# Patient Record
Sex: Female | Born: 1970 | Hispanic: Refuse to answer | Marital: Married | Smoking: Never smoker
Health system: Southern US, Community
[De-identification: ages and names within clinical notes are randomized; demographics above are authoritative.]

## PROBLEM LIST (undated history)

## (undated) DIAGNOSIS — I499 Cardiac arrhythmia, unspecified: Secondary | ICD-10-CM

## (undated) DIAGNOSIS — M545 Low back pain, unspecified: Secondary | ICD-10-CM

## (undated) DIAGNOSIS — F32A Depression, unspecified: Secondary | ICD-10-CM

## (undated) DIAGNOSIS — E079 Disorder of thyroid, unspecified: Secondary | ICD-10-CM

## (undated) DIAGNOSIS — G43909 Migraine, unspecified, not intractable, without status migrainosus: Secondary | ICD-10-CM

## (undated) DIAGNOSIS — R2689 Other abnormalities of gait and mobility: Secondary | ICD-10-CM

## (undated) DIAGNOSIS — K649 Unspecified hemorrhoids: Secondary | ICD-10-CM

## (undated) DIAGNOSIS — G47419 Narcolepsy without cataplexy: Secondary | ICD-10-CM

## (undated) DIAGNOSIS — F329 Major depressive disorder, single episode, unspecified: Secondary | ICD-10-CM

## (undated) DIAGNOSIS — Z86010 Personal history of colonic polyps: Secondary | ICD-10-CM

## (undated) DIAGNOSIS — N6019 Diffuse cystic mastopathy of unspecified breast: Secondary | ICD-10-CM

## (undated) DIAGNOSIS — N9419 Other specified dyspareunia: Secondary | ICD-10-CM

## (undated) DIAGNOSIS — M25512 Pain in left shoulder: Secondary | ICD-10-CM

## (undated) DIAGNOSIS — F419 Anxiety disorder, unspecified: Secondary | ICD-10-CM

## (undated) DIAGNOSIS — E875 Hyperkalemia: Secondary | ICD-10-CM

## (undated) DIAGNOSIS — M5090 Cervical disc disorder, unspecified, unspecified cervical region: Secondary | ICD-10-CM

## (undated) DIAGNOSIS — G47411 Narcolepsy with cataplexy: Secondary | ICD-10-CM

## (undated) DIAGNOSIS — R7989 Other specified abnormal findings of blood chemistry: Secondary | ICD-10-CM

## (undated) DIAGNOSIS — E785 Hyperlipidemia, unspecified: Secondary | ICD-10-CM

## (undated) DIAGNOSIS — K589 Irritable bowel syndrome without diarrhea: Secondary | ICD-10-CM

## (undated) DIAGNOSIS — I773 Arterial fibromuscular dysplasia: Secondary | ICD-10-CM

## (undated) DIAGNOSIS — M79602 Pain in left arm: Secondary | ICD-10-CM

## (undated) DIAGNOSIS — E119 Type 2 diabetes mellitus without complications: Secondary | ICD-10-CM

## (undated) DIAGNOSIS — E559 Vitamin D deficiency, unspecified: Secondary | ICD-10-CM

## (undated) DIAGNOSIS — R600 Localized edema: Secondary | ICD-10-CM

## (undated) DIAGNOSIS — M797 Fibromyalgia: Secondary | ICD-10-CM

## (undated) DIAGNOSIS — Z860101 Personal history of adenomatous and serrated colon polyps: Secondary | ICD-10-CM

## (undated) DIAGNOSIS — K760 Fatty (change of) liver, not elsewhere classified: Secondary | ICD-10-CM

## (undated) DIAGNOSIS — R55 Syncope and collapse: Secondary | ICD-10-CM

## (undated) DIAGNOSIS — I10 Essential (primary) hypertension: Secondary | ICD-10-CM

## (undated) DIAGNOSIS — J3089 Other allergic rhinitis: Secondary | ICD-10-CM

## (undated) HISTORY — PX: ABDOMINAL HYSTERECTOMY: SHX81

## (undated) HISTORY — PX: HERNIA REPAIR: SHX51

## (undated) HISTORY — PX: HEMORRHOID SURGERY: SHX153

---

## 2004-07-06 ENCOUNTER — Emergency Department: Payer: Self-pay | Admitting: Emergency Medicine

## 2006-03-21 ENCOUNTER — Emergency Department (HOSPITAL_COMMUNITY): Admission: EM | Admit: 2006-03-21 | Discharge: 2006-03-21 | Payer: Self-pay | Admitting: Emergency Medicine

## 2006-08-15 ENCOUNTER — Ambulatory Visit: Payer: Self-pay | Admitting: *Deleted

## 2006-09-13 ENCOUNTER — Emergency Department: Payer: Self-pay | Admitting: Emergency Medicine

## 2006-09-13 ENCOUNTER — Other Ambulatory Visit: Payer: Self-pay

## 2007-07-27 ENCOUNTER — Emergency Department (HOSPITAL_COMMUNITY): Admission: EM | Admit: 2007-07-27 | Discharge: 2007-07-27 | Payer: Self-pay | Admitting: Emergency Medicine

## 2007-12-16 ENCOUNTER — Ambulatory Visit: Payer: Self-pay | Admitting: Family Medicine

## 2008-09-08 ENCOUNTER — Emergency Department: Payer: Self-pay | Admitting: Emergency Medicine

## 2008-12-03 ENCOUNTER — Ambulatory Visit: Payer: Self-pay | Admitting: Surgery

## 2008-12-10 ENCOUNTER — Ambulatory Visit: Payer: Self-pay | Admitting: Surgery

## 2008-12-19 ENCOUNTER — Emergency Department (HOSPITAL_COMMUNITY): Admission: EM | Admit: 2008-12-19 | Discharge: 2008-12-19 | Payer: Self-pay | Admitting: Emergency Medicine

## 2009-09-12 ENCOUNTER — Ambulatory Visit: Payer: Self-pay | Admitting: Family Medicine

## 2009-10-21 ENCOUNTER — Ambulatory Visit: Payer: Self-pay

## 2009-11-24 ENCOUNTER — Ambulatory Visit: Payer: Self-pay

## 2009-12-01 ENCOUNTER — Ambulatory Visit: Payer: Self-pay

## 2009-12-03 ENCOUNTER — Inpatient Hospital Stay: Payer: Self-pay

## 2010-06-14 ENCOUNTER — Emergency Department (HOSPITAL_COMMUNITY)
Admission: EM | Admit: 2010-06-14 | Discharge: 2010-06-14 | Payer: Self-pay | Source: Home / Self Care | Admitting: Emergency Medicine

## 2010-08-17 ENCOUNTER — Ambulatory Visit: Payer: Self-pay | Admitting: Gastroenterology

## 2010-08-18 LAB — PATHOLOGY REPORT

## 2010-08-28 LAB — DIFFERENTIAL
Basophils Absolute: 0 10*3/uL (ref 0.0–0.1)
Basophils Relative: 0 % (ref 0–1)
Eosinophils Absolute: 0.1 10*3/uL (ref 0.0–0.7)
Eosinophils Relative: 1 % (ref 0–5)
Lymphocytes Relative: 20 % (ref 12–46)
Lymphs Abs: 2.6 10*3/uL (ref 0.7–4.0)
Monocytes Absolute: 1.2 10*3/uL — ABNORMAL HIGH (ref 0.1–1.0)
Neutro Abs: 9 10*3/uL — ABNORMAL HIGH (ref 1.7–7.7)

## 2010-08-28 LAB — COMPREHENSIVE METABOLIC PANEL
Albumin: 3.5 g/dL (ref 3.5–5.2)
CO2: 24 mEq/L (ref 19–32)
Chloride: 104 mEq/L (ref 96–112)
Glucose, Bld: 137 mg/dL — ABNORMAL HIGH (ref 70–99)
Potassium: 3.3 mEq/L — ABNORMAL LOW (ref 3.5–5.1)
Sodium: 137 mEq/L (ref 135–145)

## 2010-08-28 LAB — URINE MICROSCOPIC-ADD ON

## 2010-08-28 LAB — CBC
MCH: 29.4 pg (ref 26.0–34.0)
MCHC: 35 g/dL (ref 30.0–36.0)
RDW: 12.7 % (ref 11.5–15.5)

## 2010-08-28 LAB — POCT CARDIAC MARKERS: Troponin i, poc: <0.05 ng/mL (ref 0.00–0.09)

## 2010-08-28 LAB — URINALYSIS, ROUTINE W REFLEX MICROSCOPIC
Bilirubin Urine: NEGATIVE
Nitrite: NEGATIVE
Protein, ur: NEGATIVE mg/dL
Specific Gravity, Urine: 1.02 (ref 1.005–1.030)
Urobilinogen, UA: 0.2 mg/dL (ref 0.0–1.0)

## 2010-09-24 LAB — URINE MICROSCOPIC-ADD ON

## 2010-09-24 LAB — URINALYSIS, ROUTINE W REFLEX MICROSCOPIC

## 2010-09-24 LAB — PREGNANCY, URINE: Preg Test, Ur: NEGATIVE

## 2011-02-21 ENCOUNTER — Ambulatory Visit: Payer: Self-pay | Admitting: Neurology

## 2011-03-09 LAB — URINALYSIS, ROUTINE W REFLEX MICROSCOPIC
Bilirubin Urine: NEGATIVE
Glucose, UA: NEGATIVE
Specific Gravity, Urine: 1.02

## 2011-03-09 LAB — PREGNANCY, URINE: Preg Test, Ur: NEGATIVE

## 2011-03-09 LAB — URINE MICROSCOPIC-ADD ON

## 2011-10-15 ENCOUNTER — Emergency Department: Payer: Self-pay | Admitting: Emergency Medicine

## 2011-10-15 LAB — BASIC METABOLIC PANEL
Anion Gap: 4 — ABNORMAL LOW (ref 7–16)
BUN: 9 mg/dL (ref 7–18)
Calcium, Total: 9.3 mg/dL (ref 8.5–10.1)
Chloride: 104 mmol/L (ref 98–107)
Co2: 29 mmol/L (ref 21–32)
Creatinine: 0.79 mg/dL (ref 0.60–1.30)
EGFR (African American): >60
EGFR (Non-African Amer.): >60
Glucose: 101 mg/dL — ABNORMAL HIGH (ref 65–99)
Osmolality: 273 (ref 275–301)
Potassium: 4.7 mmol/L (ref 3.5–5.1)
Sodium: 137 mmol/L (ref 136–145)

## 2011-10-15 LAB — CBC
HCT: 44.3 % (ref 35.0–47.0)
HGB: 14.8 g/dL (ref 12.0–16.0)
MCH: 29.9 pg (ref 26.0–34.0)
MCHC: 33.5 g/dL (ref 32.0–36.0)
MCV: 90 fL (ref 80–100)
Platelet: 328 10*3/uL (ref 150–440)
RBC: 4.95 10*6/uL (ref 3.80–5.20)
RDW: 13.2 % (ref 11.5–14.5)
WBC: 9.6 10*3/uL (ref 3.6–11.0)

## 2011-10-15 LAB — TROPONIN I: Troponin-I: <0.02 ng/mL

## 2011-12-26 ENCOUNTER — Ambulatory Visit: Payer: Self-pay | Admitting: Family Medicine

## 2013-05-11 ENCOUNTER — Emergency Department: Payer: Self-pay | Admitting: Emergency Medicine

## 2013-05-11 LAB — CBC
HGB: 12.5 g/dL (ref 12.0–16.0)
MCH: 29.6 pg (ref 26.0–34.0)
MCHC: 33.3 g/dL (ref 32.0–36.0)
MCV: 89 fL (ref 80–100)
Platelet: 325 10*3/uL (ref 150–440)
RDW: 13.4 % (ref 11.5–14.5)

## 2013-05-11 LAB — TROPONIN I: Troponin-I: <0.02 ng/mL

## 2013-05-11 LAB — COMPREHENSIVE METABOLIC PANEL
Albumin: 3.4 g/dL (ref 3.4–5.0)
Alkaline Phosphatase: 89 U/L
BUN: 12 mg/dL (ref 7–18)
Calcium, Total: 8.9 mg/dL (ref 8.5–10.1)
Chloride: 107 mmol/L (ref 98–107)
Co2: 25 mmol/L (ref 21–32)
Creatinine: 1.13 mg/dL (ref 0.60–1.30)
EGFR (African American): >60
EGFR (Non-African Amer.): 59 — ABNORMAL LOW
Glucose: 122 mg/dL — ABNORMAL HIGH (ref 65–99)
Osmolality: 275 (ref 275–301)
Total Protein: 7.1 g/dL (ref 6.4–8.2)

## 2013-05-11 LAB — URINALYSIS, COMPLETE
Glucose,UR: NEGATIVE mg/dL (ref 0–75)
Hyaline Cast: 24
Ph: 7 (ref 4.5–8.0)
Protein: NEGATIVE
RBC,UR: 2 /HPF (ref 0–5)

## 2013-05-11 LAB — POTASSIUM: Potassium: 4.5 mmol/L (ref 3.5–5.1)

## 2014-01-18 ENCOUNTER — Emergency Department (HOSPITAL_COMMUNITY): Payer: Medicare Other

## 2014-01-18 ENCOUNTER — Encounter (HOSPITAL_COMMUNITY): Payer: Self-pay | Admitting: Emergency Medicine

## 2014-01-18 ENCOUNTER — Emergency Department (HOSPITAL_COMMUNITY)
Admission: EM | Admit: 2014-01-18 | Discharge: 2014-01-19 | Disposition: A | Discharge status: Home / Self Care | Payer: Medicare Other | Attending: Emergency Medicine | Admitting: Emergency Medicine

## 2014-01-18 DIAGNOSIS — F3289 Other specified depressive episodes: Secondary | ICD-10-CM | POA: Insufficient documentation

## 2014-01-18 DIAGNOSIS — I499 Cardiac arrhythmia, unspecified: Secondary | ICD-10-CM | POA: Diagnosis not present

## 2014-01-18 DIAGNOSIS — Z8742 Personal history of other diseases of the female genital tract: Secondary | ICD-10-CM | POA: Insufficient documentation

## 2014-01-18 DIAGNOSIS — F411 Generalized anxiety disorder: Secondary | ICD-10-CM | POA: Insufficient documentation

## 2014-01-18 DIAGNOSIS — Z860100 Personal history of colon polyps, unspecified: Secondary | ICD-10-CM | POA: Insufficient documentation

## 2014-01-18 DIAGNOSIS — G43909 Migraine, unspecified, not intractable, without status migrainosus: Secondary | ICD-10-CM | POA: Diagnosis not present

## 2014-01-18 DIAGNOSIS — R079 Chest pain, unspecified: Secondary | ICD-10-CM | POA: Diagnosis present

## 2014-01-18 DIAGNOSIS — IMO0001 Reserved for inherently not codable concepts without codable children: Secondary | ICD-10-CM | POA: Insufficient documentation

## 2014-01-18 DIAGNOSIS — Z8639 Personal history of other endocrine, nutritional and metabolic disease: Secondary | ICD-10-CM | POA: Diagnosis not present

## 2014-01-18 DIAGNOSIS — I10 Essential (primary) hypertension: Secondary | ICD-10-CM | POA: Insufficient documentation

## 2014-01-18 DIAGNOSIS — F329 Major depressive disorder, single episode, unspecified: Secondary | ICD-10-CM | POA: Insufficient documentation

## 2014-01-18 DIAGNOSIS — M791 Myalgia, unspecified site: Secondary | ICD-10-CM

## 2014-01-18 DIAGNOSIS — Z7982 Long term (current) use of aspirin: Secondary | ICD-10-CM | POA: Diagnosis not present

## 2014-01-18 DIAGNOSIS — Z862 Personal history of diseases of the blood and blood-forming organs and certain disorders involving the immune mechanism: Secondary | ICD-10-CM | POA: Insufficient documentation

## 2014-01-18 DIAGNOSIS — Z79899 Other long term (current) drug therapy: Secondary | ICD-10-CM | POA: Diagnosis not present

## 2014-01-18 DIAGNOSIS — M25519 Pain in unspecified shoulder: Secondary | ICD-10-CM | POA: Diagnosis not present

## 2014-01-18 DIAGNOSIS — R0789 Other chest pain: Secondary | ICD-10-CM | POA: Insufficient documentation

## 2014-01-18 DIAGNOSIS — Z8669 Personal history of other diseases of the nervous system and sense organs: Secondary | ICD-10-CM | POA: Diagnosis not present

## 2014-01-18 DIAGNOSIS — M25512 Pain in left shoulder: Secondary | ICD-10-CM

## 2014-01-18 HISTORY — DX: Hyperkalemia: E87.5

## 2014-01-18 HISTORY — DX: Personal history of colonic polyps: Z86.010

## 2014-01-18 HISTORY — DX: Other specified abnormal findings of blood chemistry: R79.89

## 2014-01-18 HISTORY — DX: Personal history of adenomatous and serrated colon polyps: Z86.0101

## 2014-01-18 HISTORY — DX: Depression, unspecified: F32.A

## 2014-01-18 HISTORY — DX: Migraine, unspecified, not intractable, without status migrainosus: G43.909

## 2014-01-18 HISTORY — DX: Irritable bowel syndrome, unspecified: K58.9

## 2014-01-18 HISTORY — DX: Arterial fibromuscular dysplasia: I77.3

## 2014-01-18 HISTORY — DX: Narcolepsy without cataplexy: G47.419

## 2014-01-18 HISTORY — DX: Disorder of thyroid, unspecified: E07.9

## 2014-01-18 HISTORY — DX: Narcolepsy with cataplexy: G47.411

## 2014-01-18 HISTORY — DX: Cardiac arrhythmia, unspecified: I49.9

## 2014-01-18 HISTORY — DX: Fibromyalgia: M79.7

## 2014-01-18 HISTORY — DX: Syncope and collapse: R55

## 2014-01-18 HISTORY — DX: Hyperlipidemia, unspecified: E78.5

## 2014-01-18 HISTORY — DX: Anxiety disorder, unspecified: F41.9

## 2014-01-18 HISTORY — DX: Diffuse cystic mastopathy of unspecified breast: N60.19

## 2014-01-18 HISTORY — DX: Essential (primary) hypertension: I10

## 2014-01-18 HISTORY — DX: Major depressive disorder, single episode, unspecified: F32.9

## 2014-01-18 HISTORY — DX: Unspecified hemorrhoids: K64.9

## 2014-01-18 LAB — CBC WITH DIFFERENTIAL/PLATELET
BASOS ABS: 0 10*3/uL (ref 0.0–0.1)
Basophils Relative: 0 % (ref 0–1)
EOS PCT: 2 % (ref 0–5)
Eosinophils Absolute: 0.2 10*3/uL (ref 0.0–0.7)
HCT: 37.7 % (ref 36.0–46.0)
Hemoglobin: 13 g/dL (ref 12.0–15.0)
LYMPHS PCT: 19 % (ref 12–46)
Lymphs Abs: 2.4 10*3/uL (ref 0.7–4.0)
MCH: 29.9 pg (ref 26.0–34.0)
MCHC: 34.5 g/dL (ref 30.0–36.0)
MCV: 86.7 fL (ref 78.0–100.0)
MONOS PCT: 11 % (ref 3–12)
Monocytes Absolute: 1.4 10*3/uL — ABNORMAL HIGH (ref 0.1–1.0)
NEUTROS PCT: 68 % (ref 43–77)
Neutro Abs: 8.3 10*3/uL — ABNORMAL HIGH (ref 1.7–7.7)
PLATELETS: 343 10*3/uL (ref 150–400)
RBC: 4.35 MIL/uL (ref 3.87–5.11)
RDW: 12.9 % (ref 11.5–15.5)
WBC: 12.3 10*3/uL — AB (ref 4.0–10.5)

## 2014-01-18 LAB — BASIC METABOLIC PANEL
Anion gap: 13 (ref 5–15)
BUN: 8 mg/dL (ref 6–23)
CALCIUM: 9.1 mg/dL (ref 8.4–10.5)
CHLORIDE: 100 meq/L (ref 96–112)
CO2: 26 mEq/L (ref 19–32)
Creatinine, Ser: 0.75 mg/dL (ref 0.50–1.10)
GLUCOSE: 154 mg/dL — AB (ref 70–99)
Potassium: 3.1 mEq/L — ABNORMAL LOW (ref 3.7–5.3)
Sodium: 139 mEq/L (ref 137–147)

## 2014-01-18 LAB — TROPONIN I

## 2014-01-18 LAB — ACETAMINOPHEN LEVEL: Acetaminophen (Tylenol), Serum: <15 ug/mL (ref 10–30)

## 2014-01-18 MED ORDER — KETOROLAC TROMETHAMINE 30 MG/ML IJ SOLN
30.0000 mg | Freq: Once | INTRAMUSCULAR | Status: AC
  Administered 2014-01-18: 30 mg via INTRAVENOUS
  Filled 2014-01-18: qty 1

## 2014-01-18 MED ORDER — HYDROCODONE-ACETAMINOPHEN 5-325 MG PO TABS: 1.0000 | ORAL_TABLET | ORAL | Status: DC | PRN

## 2014-01-18 NOTE — ED Provider Notes (Signed)
CSN: [id number]     Arrival date & time 01/18/14  2007 History  This chart was scribed for Tanna Furry, MD by Lowella Petties, ED Scribe. The patient was seen in room APA05/APA05. Patient's care was started at 9:11 PM.   Chief Complaint  Patient presents with  . Chest Pain   The history is provided by the patient. No language interpreter was used.  HPI Comments: Christina Decker is a 43 y.o. female who presents to the Emergency Department complaining of centralised, chest pressure that began 2 hours ago and lasted 20 min. She reports that it feels better now, and that she has had pain like this off and on for years. She denies chest tenderness to palpation. She also reports shooting pain in her left arm from the shoulder down to the wrist for the past two weeks. She states that moving or leaning on her left arm makes it worse. She denies any unusual activity with her left arm. She states that she had a recent ultrasound which showed fibromuscular hyperplasia. She reports a history of abnormal EKG and arrythmia. She reports that her mom had a CABG.    Past Medical History  Diagnosis Date  . Thyroid disease   . Abnormal thyroid stimulating hormone level   . Fibromuscular hyperplasia   . Abnormal heart rhythms   . Syncope   . Narcolepsy   . Narcolepsy and cataplexy   . Dyslipidemia   . Fibromyalgia   . Fibrocystic breast disease   . IBS (irritable bowel syndrome)   . Migraine   . Anxiety   . Depression   . Hypertension   . Hx of adenomatous colonic polyps   . Hemorrhoid   . Hyperkalemia    Past Surgical History  Procedure Laterality Date  . Abdominal hysterectomy    . Hemorrhoid surgery    . Hernia repair     History reviewed. No pertinent family history. History  Substance Use Topics  . Smoking status: Never Smoker   . Smokeless tobacco: Not on file  . Alcohol Use: Yes     Comment: occasional   OB History   Grav Para Term Preterm Abortions TAB SAB Ect Mult Living                  Review of Systems  Constitutional: Negative for fever, chills, diaphoresis, appetite change and fatigue.  HENT: Negative for mouth sores, sore throat and trouble swallowing.   Eyes: Negative for visual disturbance.  Respiratory: Positive for chest tightness. Negative for cough, shortness of breath and wheezing.   Cardiovascular: Positive for chest pain (centralized and left sided).  Gastrointestinal: Negative for nausea, vomiting, abdominal pain, diarrhea and abdominal distention.  Endocrine: Negative for polydipsia, polyphagia and polyuria.  Genitourinary: Negative for dysuria, frequency and hematuria.  Musculoskeletal: Positive for arthralgias (left shoulder and arm). Negative for gait problem.  Skin: Negative for color change, pallor and rash.  Neurological: Negative for dizziness, syncope, light-headedness and headaches.  Hematological: Does not bruise/bleed easily.  Psychiatric/Behavioral: Negative for behavioral problems and confusion.    Allergies  Review of patient's allergies indicates no known allergies.  Home Medications   Prior to Admission medications   Medication Sig Start Date End Date Taking? Authorizing Provider  acetaminophen (TYLENOL) 500 MG tablet Take 500 mg by mouth every 6 (six) hours as needed for mild pain or moderate pain.   Yes Historical Provider, MD  amLODipine (NORVASC) 5 MG tablet Take 5 mg by mouth daily.  01/12/14  Yes Historical Provider, MD  aspirin EC 81 MG tablet Take 81 mg by mouth daily.   Yes Historical Provider, MD  cyclobenzaprine (FLEXERIL) 10 MG tablet Take 10 mg by mouth 3 (three) times daily as needed. For muscle spasms   Yes Historical Provider, MD  diazepam (VALIUM) 10 MG tablet Take 10 mg by mouth every 6 (six) hours as needed for anxiety.  09/25/13  Yes Historical Provider, MD  DULoxetine (CYMBALTA) 60 MG capsule Take 60 mg by mouth daily. *May take an additional dose if needed* 12/21/13  Yes Historical Provider, MD  hydrochlorothiazide  (HYDRODIURIL) 12.5 MG tablet Take 12.5 mg by mouth daily. 01/17/14  Yes Historical Provider, MD  HYDROcodone-acetaminophen (NORCO/VICODIN) 5-325 MG per tablet Take 1 tablet by mouth every 4 (four) hours as needed. 01/18/14   Tanna Furry, MD  lisinopril (PRINIVIL,ZESTRIL) 40 MG tablet Take 40 mg by mouth daily. 01/17/14  Yes Historical Provider, MD  MAGNESIUM PO Take 1 tablet by mouth daily.   Yes Historical Provider, MD  methylphenidate (RITALIN) 20 MG tablet Take 20 mg by mouth daily as needed (when working/driving).  12/08/13  Yes Historical Provider, MD  metoprolol (LOPRESSOR) 100 MG tablet Take 100 mg by mouth 2 (two) times daily. 12/21/13  Yes Historical Provider, MD  nystatin (MYCOSTATIN/NYSTOP) 100000 UNIT/GM POWD Apply 1 application topically 2 (two) times daily. 01/06/14   Historical Provider, MD  Vitamin D, Ergocalciferol, (DRISDOL) 50000 UNITS CAPS capsule Take 1 capsule by mouth every 6 (six) months.     Historical Provider, MD   Triage Vitals: BP 149/101  Pulse 85  Temp(Src) 99.4 F (37.4 C) (Oral)  Resp 18  Ht 5\' 6"  (1.676 m)  Wt 204 lb (92.534 kg)  BMI 32.94 kg/m2  SpO2 96% Physical Exam  Constitutional: She is oriented to person, place, and time. She appears well-developed and well-nourished. No distress.  HENT:  Head: Normocephalic.  Eyes: Conjunctivae are normal. Pupils are equal, round, and reactive to light. No scleral icterus.  Neck: Normal range of motion. Neck supple. No thyromegaly present.  Cardiovascular: Normal rate and regular rhythm.  Exam reveals no gallop and no friction rub.   No murmur heard. Pulmonary/Chest: Effort normal and breath sounds normal. No respiratory distress. She has no wheezes. She has no rales.  Abdominal: Soft. Bowel sounds are normal. She exhibits no distension. There is no tenderness. There is no rebound.  Musculoskeletal: Normal range of motion.  Diffuse tenderness in left pectoral and anterior left deltoid.   Neurological: She is alert and  oriented to person, place, and time.  Skin: Skin is warm and dry. No rash noted.  Psychiatric: She has a normal mood and affect. Her behavior is normal.    ED Course  Procedures (including critical care time) DIAGNOSTIC STUDIES: Oxygen Saturation is 96% on room air, normal by my interpretation.    COORDINATION OF CARE: 9:16 PM-Discussed treatment plan which includes EKG with pt at bedside and pt agreed to plan.   Labs Review Labs Reviewed  CBC WITH DIFFERENTIAL - Abnormal; Notable for the following:    WBC 12.3 (*)    Neutro Abs 8.3 (*)    Monocytes Absolute 1.4 (*)    All other components within normal limits  BASIC METABOLIC PANEL - Abnormal; Notable for the following:    Potassium 3.1 (*)    Glucose, Bld 154 (*)    All other components within normal limits  TROPONIN I  ACETAMINOPHEN LEVEL  TROPONIN I  Imaging Review Dg Chest 2 View  01/18/2014   CLINICAL DATA:  Chest pain  EXAM: CHEST  2 VIEW  COMPARISON:  06/14/2010  FINDINGS: Normal heart size and mediastinal contours. No acute infiltrate or edema. No effusion or pneumothorax. Symmetric biapical pleural thickening. No acute osseous findings.  IMPRESSION: No active cardiopulmonary disease.   Electronically Signed   By: Jorje Guild M.D.   On: 01/18/2014 21:10     EKG Interpretation None      MDM   Final diagnoses:  Chest pain, unspecified chest pain type  Left shoulder pain  Muscular pain    EKG shows inverted T waves 1 and aVL. Sinus rhythm. No ectopy. This pain is clearly musculoskeletal. Her lungs are normal. She is appropriate for discharge home.   I personally performed the services described in this documentation, which was scribed in my presence. The recorded information has been reviewed and is accurate.    Tanna Furry, MD 01/18/14 [phone]

## 2014-01-18 NOTE — Discharge Instructions (Signed)
Chest Wall Pain Chest wall pain is pain in or around the bones and muscles of your chest. It may take up to 6 weeks to get better. It may take longer if you must stay physically active in your work and activities.  CAUSES  Chest wall pain may happen on its own. However, it may be caused by:  A viral illness like the flu.  Injury.  Coughing.  Exercise.  Arthritis.  Fibromyalgia.  Shingles. HOME CARE INSTRUCTIONS   Avoid overtiring physical activity. Try not to strain or perform activities that cause pain. This includes any activities using your chest or your abdominal and side muscles, especially if heavy weights are used.  Put ice on the sore area.  Put ice in a plastic bag.  Place a towel between your skin and the bag.  Leave the ice on for 15-20 minutes per hour while awake for the first 2 days.  Only take over-the-counter or prescription medicines for pain, discomfort, or fever as directed by your caregiver. SEEK IMMEDIATE MEDICAL CARE IF:   Your pain increases, or you are very uncomfortable.  You have a fever.  Your chest pain becomes worse.  You have new, unexplained symptoms.  You have nausea or vomiting.  You feel sweaty or lightheaded.  You have a cough with phlegm (sputum), or you cough up blood. MAKE SURE YOU:   Understand these instructions.  Will watch your condition.  Will get help right away if you are not doing well or get worse. Document Released: 06/04/2005 Document Revised: 08/27/2011 Document Reviewed: 01/29/2011 [facility] Patient Information 2015 [place], [place]. This information is not intended to replace advice given to you by your health care provider. Make sure you discuss any questions you have with your health care provider.  Muscle Pain Muscle pain (myalgia) may be caused by many things, including:  Overuse or muscle strain, especially if you are not in shape. This is the most common cause of muscle  pain.  Injury.  Bruises.  Viruses, such as the flu.  Infectious diseases.  Fibromyalgia, which is a chronic condition that causes muscle tenderness, fatigue, and headache.  Autoimmune diseases, including lupus.  Certain drugs, including ACE inhibitors and statins. Muscle pain may be mild or severe. In most cases, the pain lasts only a short time and goes away without treatment. To diagnose the cause of your muscle pain, your health care provider will take your medical history. This means he or she will ask you when your muscle pain began and what has been happening. If you have not had muscle pain for very long, your health care provider may want to wait before doing much testing. If your muscle pain has lasted a long time, your health care provider may want to run tests right away. If your health care provider thinks your muscle pain may be caused by illness, you may need to have additional tests to rule out certain conditions.  Treatment for muscle pain depends on the cause. Home care is often enough to relieve muscle pain. Your health care provider may also prescribe anti-inflammatory medicine. HOME CARE INSTRUCTIONS Watch your condition for any changes. The following actions may help to lessen any discomfort you are feeling:  Only take over-the-counter or prescription medicines as directed by your health care provider.  Apply ice to the sore muscle:  Put ice in a plastic bag.  Place a towel between your skin and the bag.  Leave the ice on for 15-20 minutes, 3-4 times  a day.  You may alternate applying hot and cold packs to the muscle as directed by your health care provider.  If overuse is causing your muscle pain, slow down your activities until the pain goes away.  Remember that it is normal to feel some muscle pain after starting a workout program. Muscles that have not been used often will be sore at first.  Do regular, gentle exercises if you are not usually  active.  Warm up before exercising to lower your risk of muscle pain.  Do not continue working out if the pain is very bad. Bad pain could mean you have injured a muscle. SEEK MEDICAL CARE IF:  Your muscle pain gets worse, and medicines do not help.  You have muscle pain that lasts longer than 3 days.  You have a rash or fever along with muscle pain.  You have muscle pain after a tick bite.  You have muscle pain while working out, even though you are in good physical condition.  You have redness, soreness, or swelling along with muscle pain.  You have muscle pain after starting a new medicine or changing the dose of a medicine. SEEK IMMEDIATE MEDICAL CARE IF:  You have trouble breathing.  You have trouble swallowing.  You have muscle pain along with a stiff neck, fever, and vomiting.  You have severe muscle weakness or cannot move part of your body. MAKE SURE YOU:   Understand these instructions.  Will watch your condition.  Will get help right away if you are not doing well or get worse. Document Released: 04/26/2006 Document Revised: 06/09/2013 Document Reviewed: 03/31/2013 [facility] Patient Information 2015 [place], [place]. This information is not intended to replace advice given to you by your health care provider. Make sure you discuss any questions you have with your health care provider.  Shoulder Pain The shoulder is the joint that connects your arm to your body. Muscles and band-like tissues that connect bones to muscles (tendons) hold the joint together. Shoulder pain is felt if an injury or medical problem affects one or more parts of the shoulder. HOME CARE   Put ice on the sore area.  Put ice in a plastic bag.  Place a towel between your skin and the bag.  Leave the ice on for 15-20 minutes, 03-04 times a day for the first 2 days.  Stop using cold packs if they do not help with the pain.  If you were given something to keep your shoulder from moving  (sling; shoulder immobilizer), wear it as told. Only take it off to shower or bathe.  Move your arm as little as possible, but keep your hand moving to prevent puffiness (swelling).  Squeeze a soft ball or foam pad as much as possible to help prevent swelling.  Take medicine as told by your doctor. GET HELP IF:  You have progressing new pain in your arm, hand, or fingers.  Your hand or fingers get cold.  Your medicine does not help lessen your pain. GET HELP RIGHT AWAY IF:   Your arm, hand, or fingers are numb or tingling.  Your arm, hand, or fingers are puffy (swollen), painful, or turn white or blue. MAKE SURE YOU:   Understand these instructions.  Will watch your condition.  Will get help right away if you are not doing well or get worse. Document Released: 11/21/2007 Document Revised: 10/19/2013 Document Reviewed: 12/17/2011 [facility] Patient Information 2015 [place], [place]. This information is not intended to replace advice  given to you by your health care provider. Make sure you discuss any questions you have with your health care provider.

## 2014-01-18 NOTE — ED Notes (Signed)
Patient reports chest pain for approximately an hour with back pain and neck pain. Also reports chronic neck pain.

## 2014-02-12 DIAGNOSIS — R079 Chest pain, unspecified: Secondary | ICD-10-CM | POA: Insufficient documentation

## 2014-02-18 DIAGNOSIS — E782 Mixed hyperlipidemia: Secondary | ICD-10-CM | POA: Insufficient documentation

## 2014-02-18 DIAGNOSIS — G47419 Narcolepsy without cataplexy: Secondary | ICD-10-CM | POA: Insufficient documentation

## 2014-02-18 DIAGNOSIS — F419 Anxiety disorder, unspecified: Secondary | ICD-10-CM | POA: Insufficient documentation

## 2014-02-18 DIAGNOSIS — F410 Panic disorder [episodic paroxysmal anxiety] without agoraphobia: Secondary | ICD-10-CM | POA: Insufficient documentation

## 2014-02-18 DIAGNOSIS — E669 Obesity, unspecified: Secondary | ICD-10-CM | POA: Insufficient documentation

## 2014-03-10 ENCOUNTER — Encounter: Payer: Self-pay | Admitting: Family Medicine

## 2014-03-18 ENCOUNTER — Encounter: Payer: Self-pay | Admitting: Family Medicine

## 2014-04-12 DIAGNOSIS — M545 Low back pain, unspecified: Secondary | ICD-10-CM | POA: Insufficient documentation

## 2014-04-18 ENCOUNTER — Encounter: Payer: Self-pay | Admitting: Family Medicine

## 2016-03-26 ENCOUNTER — Encounter (HOSPITAL_COMMUNITY): Payer: Self-pay

## 2016-03-26 DIAGNOSIS — M25512 Pain in left shoulder: Secondary | ICD-10-CM | POA: Diagnosis present

## 2016-03-26 DIAGNOSIS — Z79899 Other long term (current) drug therapy: Secondary | ICD-10-CM | POA: Insufficient documentation

## 2016-03-26 DIAGNOSIS — L03031 Cellulitis of right toe: Secondary | ICD-10-CM | POA: Insufficient documentation

## 2016-03-26 DIAGNOSIS — I10 Essential (primary) hypertension: Secondary | ICD-10-CM | POA: Insufficient documentation

## 2016-03-26 DIAGNOSIS — Z7982 Long term (current) use of aspirin: Secondary | ICD-10-CM | POA: Insufficient documentation

## 2016-03-26 DIAGNOSIS — R52 Pain, unspecified: Secondary | ICD-10-CM | POA: Diagnosis not present

## 2016-03-26 NOTE — ED Triage Notes (Signed)
Pt with c/o pain to multiple sites. States she thinks her Fibromyalgia "is acting up".

## 2016-03-27 ENCOUNTER — Emergency Department (HOSPITAL_COMMUNITY): Payer: Medicare Other

## 2016-03-27 ENCOUNTER — Emergency Department (HOSPITAL_COMMUNITY)
Admission: EM | Admit: 2016-03-27 | Discharge: 2016-03-27 | Disposition: A | Discharge status: Home / Self Care | Payer: Medicare Other | Attending: Emergency Medicine | Admitting: Emergency Medicine

## 2016-03-27 DIAGNOSIS — L03031 Cellulitis of right toe: Secondary | ICD-10-CM

## 2016-03-27 DIAGNOSIS — R52 Pain, unspecified: Secondary | ICD-10-CM

## 2016-03-27 DIAGNOSIS — T1490XA Injury, unspecified, initial encounter: Secondary | ICD-10-CM

## 2016-03-27 HISTORY — DX: Other specified dyspareunia: N94.19

## 2016-03-27 HISTORY — DX: Pain in left shoulder: M25.512

## 2016-03-27 HISTORY — DX: Type 2 diabetes mellitus without complications: E11.9

## 2016-03-27 HISTORY — DX: Low back pain, unspecified: M54.50

## 2016-03-27 HISTORY — DX: Low back pain: M54.5

## 2016-03-27 HISTORY — DX: Fatty (change of) liver, not elsewhere classified: K76.0

## 2016-03-27 HISTORY — DX: Pain in left arm: M79.602

## 2016-03-27 HISTORY — DX: Vitamin D deficiency, unspecified: E55.9

## 2016-03-27 LAB — I-STAT CHEM 8, ED
BUN: 9 mg/dL (ref 6–20)
CALCIUM ION: 0.99 mmol/L — AB (ref 1.15–1.40)
Chloride: 104 mmol/L (ref 101–111)
Creatinine, Ser: 0.9 mg/dL (ref 0.44–1.00)
Glucose, Bld: 111 mg/dL — ABNORMAL HIGH (ref 65–99)
HEMATOCRIT: 39 % (ref 36.0–46.0)
HEMOGLOBIN: 13.3 g/dL (ref 12.0–15.0)
Potassium: 3.8 mmol/L (ref 3.5–5.1)
SODIUM: 137 mmol/L (ref 135–145)
TCO2: 25 mmol/L (ref 0–100)

## 2016-03-27 MED ORDER — NAPROXEN 500 MG PO TABS: 500.0000 mg | ORAL_TABLET | Freq: Two times a day (BID) | ORAL | 0 refills | Status: DC

## 2016-03-27 MED ORDER — SULFAMETHOXAZOLE-TRIMETHOPRIM 800-160 MG PO TABS: 1.0000 | ORAL_TABLET | Freq: Two times a day (BID) | ORAL | 0 refills | Status: AC

## 2016-03-27 MED ORDER — HYDROCODONE-ACETAMINOPHEN 5-325 MG PO TABS
1.0000 | ORAL_TABLET | Freq: Once | ORAL | Status: AC
  Administered 2016-03-27: 1 via ORAL
  Filled 2016-03-27: qty 1

## 2016-03-27 MED ORDER — GABAPENTIN 300 MG PO CAPS: 300.0000 mg | ORAL_CAPSULE | Freq: Two times a day (BID) | ORAL | 0 refills | Status: DC

## 2016-03-27 MED ORDER — METHYLPREDNISOLONE 4 MG PO TBPK: ORAL_TABLET | ORAL | 0 refills | Status: DC

## 2016-03-27 MED ORDER — KETOROLAC TROMETHAMINE 30 MG/ML IJ SOLN
15.0000 mg | Freq: Once | INTRAMUSCULAR | Status: AC
  Administered 2016-03-27: 15 mg via INTRAMUSCULAR
  Filled 2016-03-27: qty 1

## 2016-03-27 MED ORDER — IBUPROFEN 400 MG PO TABS
400.0000 mg | ORAL_TABLET | Freq: Once | ORAL | Status: AC
  Administered 2016-03-27: 400 mg via ORAL
  Filled 2016-03-27: qty 1

## 2016-03-27 MED ORDER — LIDOCAINE HCL (PF) 1 % IJ SOLN
5.0000 mL | Freq: Once | INTRAMUSCULAR | Status: DC
Start: 2016-03-27 — End: 2016-03-27
  Filled 2016-03-27: qty 5

## 2016-03-27 NOTE — Discharge Instructions (Addendum)
You were seen today for generalized pain and also noted to have an infection of her right third toe. You'll be started on antibiotics. He need follow-up with her primary physician regarding medications for chronic pain control.

## 2016-03-27 NOTE — ED Provider Notes (Signed)
[facility] DEPT Provider Note   CSN: [id number] Arrival date & time: 03/26/16  2213  By signing my name below, I, Jeanell Sparrow, attest that this documentation has been prepared under the direction and in the presence of Merryl Hacker, MD . Electronically Signed: Jeanell Sparrow, Scribe. 03/27/2016. 12:57 AM.  History   Chief Complaint Chief Complaint  Patient presents with  . Generalized Pain   The history is provided by the patient. No language interpreter was used.   HPI Comments: Christina Decker is a 45 y.o. female with a hx of Fibromyalgia who presents to the Emergency Department complaining of constant moderate left shoulder pain that has been ongoing. Pt suspects an ongoing Fibromyalgia flare-up and came to the ED because she stubbed her left foot middle toe. She reports redness and swelling to toe injury site. She reports pain all over. Rates pain at 10 out of 10.  She states she has been taking arthritis medication without any relief. Pt reports associated symptoms of abdominal pain, back pain, BLE pain, and intermittent cough. She admits to a hx of DM. Pt denies any fever, SOB, or other complaints.   Past Medical History:  Diagnosis Date  . Abnormal heart rhythms   . Abnormal thyroid stimulating hormone level   . Anxiety   . Depression   . Diabetes mellitus without complication ([place])   . Dyslipidemia   . Dyspareunia due to medical condition in female   . Fatty liver disease, nonalcoholic   . Fibrocystic breast disease   . Fibromuscular hyperplasia ([place])   . Fibromyalgia   . Hemorrhoid   . Hx of adenomatous colonic polyps   . Hyperkalemia   . Hypertension   . IBS (irritable bowel syndrome)   . Low back pain   . Migraine   . Narcolepsy   . Narcolepsy and cataplexy   . Pain in left arm   . Pain in left shoulder   . Syncope   . Thyroid disease   . Vitamin D deficiency     There are no active problems to display for this patient.   Past Surgical History:    Procedure Laterality Date  . ABDOMINAL HYSTERECTOMY    . HEMORRHOID SURGERY    . HERNIA REPAIR      OB History    No data available       Home Medications    Prior to Admission medications   Medication Sig Start Date End Date Taking? Authorizing Provider  acetaminophen (TYLENOL) 500 MG tablet Take 500 mg by mouth every 6 (six) hours as needed for mild pain or moderate pain.    Historical Provider, MD  amLODipine (NORVASC) 5 MG tablet Take 5 mg by mouth daily. 01/12/14   Historical Provider, MD  aspirin EC 81 MG tablet Take 81 mg by mouth daily.    Historical Provider, MD  cyclobenzaprine (FLEXERIL) 10 MG tablet Take 10 mg by mouth 3 (three) times daily as needed. For muscle spasms    Historical Provider, MD  diazepam (VALIUM) 10 MG tablet Take 10 mg by mouth every 6 (six) hours as needed for anxiety.  09/25/13   Historical Provider, MD  DULoxetine (CYMBALTA) 60 MG capsule Take 60 mg by mouth daily. *May take an additional dose if needed* 12/21/13   Historical Provider, MD  gabapentin (NEURONTIN) 300 MG capsule Take 1 capsule (300 mg total) by mouth 2 (two) times daily. 03/27/16   Merryl Hacker, MD  hydrochlorothiazide (HYDRODIURIL) 12.5 MG tablet  Take 12.5 mg by mouth daily. 01/17/14   Historical Provider, MD  HYDROcodone-acetaminophen (NORCO/VICODIN) 5-325 MG per tablet Take 1 tablet by mouth every 4 (four) hours as needed. 01/18/14   Tanna Furry, MD  lisinopril (PRINIVIL,ZESTRIL) 40 MG tablet Take 40 mg by mouth daily. 01/17/14   Historical Provider, MD  MAGNESIUM PO Take 1 tablet by mouth daily.    Historical Provider, MD  methylphenidate (RITALIN) 20 MG tablet Take 20 mg by mouth daily as needed (when working/driving).  12/08/13   Historical Provider, MD  metoprolol (LOPRESSOR) 100 MG tablet Take 100 mg by mouth 2 (two) times daily. 12/21/13   Historical Provider, MD  naproxen (NAPROSYN) 500 MG tablet Take 1 tablet (500 mg total) by mouth 2 (two) times daily. 03/27/16   Merryl Hacker, MD   nystatin (MYCOSTATIN/NYSTOP) 100000 UNIT/GM POWD Apply 1 application topically 2 (two) times daily. 01/06/14   Historical Provider, MD  sulfamethoxazole-trimethoprim (BACTRIM DS,SEPTRA DS) 800-160 MG tablet Take 1 tablet by mouth 2 (two) times daily. 03/27/16 04/03/16  Merryl Hacker, MD  Vitamin D, Ergocalciferol, (DRISDOL) 50000 UNITS CAPS capsule Take 1 capsule by mouth every 6 (six) months.     Historical Provider, MD    Family History History reviewed. No pertinent family history.  Social History Social History  Substance Use Topics  . Smoking status: Never Smoker  . Smokeless tobacco: Never Used  . Alcohol use Yes     Comment: occasional     Allergies   Review of patient's allergies indicates no known allergies.   Review of Systems Review of Systems  Constitutional: Negative for fever.  Respiratory: Negative for shortness of breath.   Gastrointestinal: Negative for abdominal pain, diarrhea and vomiting.  Musculoskeletal: Positive for back pain and myalgias (RUE).  Skin: Positive for color change (Left middle toe).  All other systems reviewed and are negative.    Physical Exam Updated Vital Signs BP 150/94 (BP Location: Right Arm)   Pulse 99   Temp 98.8 F (37.1 C) (Oral)   Resp 16   Ht 5\' 5"  (1.651 m)   Wt 204 lb (92.5 kg)   SpO2 98%   BMI 33.95 kg/m   Physical Exam  Constitutional: She is oriented to person, place, and time. She appears well-developed and well-nourished.  Obese  HENT:  Head: Normocephalic and atraumatic.  Cardiovascular: Normal rate, regular rhythm and normal heart sounds.   No murmur heard. Pulmonary/Chest: Effort normal and breath sounds normal. No respiratory distress. She has no wheezes.  Abdominal: Soft. There is no tenderness.  Musculoskeletal:  No CVA or muscle tenderness noted  Neurological: She is alert and oriented to person, place, and time.  Skin: Skin is warm and dry.  Warmth and swelling noted over the lateral aspect  of the nailbed of the third digit of the right foot, mild erythema of that digit  Psychiatric: She has a normal mood and affect.  Nursing note and vitals reviewed.    ED Treatments / Results  DIAGNOSTIC STUDIES: Oxygen Saturation is 98% on RA, normal by my interpretation.    COORDINATION OF CARE: 1:01 AM- Pt advised of plan for treatment and pt agrees.  Labs (all labs ordered are listed, but only abnormal results are displayed) Labs Reviewed  I-STAT CHEM 8, ED - Abnormal; Notable for the following:       Result Value   Glucose, Bld 111 (*)    Calcium, Ion 0.99 (*)    All other components within normal  limits    EKG  EKG Interpretation None       Radiology Dg Toe 3rd Right  Result Date: 03/27/2016 CLINICAL DATA:  Acute onset of erythema, swelling and pain at the right third toe. Initial encounter. EXAM: RIGHT THIRD TOE COMPARISON:  None. FINDINGS: There is no evidence of fracture or dislocation. The right third toe appears grossly intact. Visualized joint spaces are preserved. No radiopaque foreign bodies are seen. IMPRESSION: No evidence of fracture or dislocation. Electronically Signed   By: Garald Balding M.D.   On: 03/27/2016 02:28    Procedures Procedures (including critical care time)  INCISION AND DRAINAGE Performed by: Merryl Hacker Consent: Verbal consent obtained. Risks and benefits: risks, benefits and alternatives were discussed Type: abscess  Body area: right 3rd toe, paronychia  Anesthesia: local infiltration  Incision was made with a scalpel.  Local anesthetic: lidocaine wo epinephrine  Anesthetic total: 2 ml  Complexity: simple Blunt dissection to break up loculations  Drainage: purulent  Drainage amount: moderate  Patient tolerance: Patient tolerated the procedure well with no immediate complications.     Medications Ordered in ED Medications  lidocaine (PF) (XYLOCAINE) 1 % injection 5 mL (not administered)  ibuprofen  (ADVIL,MOTRIN) tablet 400 mg (400 mg Oral Given 03/27/16 0129)  HYDROcodone-acetaminophen (NORCO/VICODIN) 5-325 MG per tablet 1 tablet (1 tablet Oral Given 03/27/16 0129)  ketorolac (TORADOL) 30 MG/ML injection 15 mg (15 mg Intramuscular Given 03/27/16 0428)     Initial Impression / Assessment and Plan / ED Course  I have reviewed the triage vital signs and the nursing notes.  Pertinent labs & imaging results that were available during my care of the patient were reviewed by me and considered in my medical decision making (see chart for details).  Clinical Course    She presents with acute on chronic generalized pain left arm pain and pain to the right third toe. While patient reports possible injury, toe appears to have a paronychia. This was incised with moderate amount of purulent drainage. We'll place on Bactrim and have patient do warm soaks. Chem-8 obtained to evaluate kidney function given history of multiple medical problems. Kidney function reassuring. Patient was given Toradol and gabapentin. Will discharge on gabapentin and naproxen. Have encouraged close follow-up for further medication given the chronic nature of her pain.  After history, exam, and medical workup I feel the patient has been appropriately medically screened and is safe for discharge home. Pertinent diagnoses were discussed with the patient. Patient was given return precautions.   Final Clinical Impressions(s) / ED Diagnoses   Final diagnoses:  Injury  Generalized pain  Paronychia of third toe of right foot    New Prescriptions New Prescriptions   GABAPENTIN (NEURONTIN) 300 MG CAPSULE    Take 1 capsule (300 mg total) by mouth 2 (two) times daily.   NAPROXEN (NAPROSYN) 500 MG TABLET    Take 1 tablet (500 mg total) by mouth 2 (two) times daily.   SULFAMETHOXAZOLE-TRIMETHOPRIM (BACTRIM DS,SEPTRA DS) 800-160 MG TABLET    Take 1 tablet by mouth 2 (two) times daily.   I personally performed the services  described in this documentation, which was scribed in my presence. The recorded information has been reviewed and is accurate.     Merryl Hacker, MD 03/27/16 [id number]

## 2016-06-04 ENCOUNTER — Encounter (INDEPENDENT_AMBULATORY_CARE_PROVIDER_SITE_OTHER): Payer: Self-pay

## 2016-06-04 ENCOUNTER — Ambulatory Visit (INDEPENDENT_AMBULATORY_CARE_PROVIDER_SITE_OTHER): Payer: Self-pay | Admitting: Vascular Surgery

## 2016-07-18 ENCOUNTER — Other Ambulatory Visit (INDEPENDENT_AMBULATORY_CARE_PROVIDER_SITE_OTHER): Payer: Self-pay | Admitting: Vascular Surgery

## 2016-07-18 DIAGNOSIS — I773 Arterial fibromuscular dysplasia: Secondary | ICD-10-CM

## 2016-07-19 ENCOUNTER — Encounter (INDEPENDENT_AMBULATORY_CARE_PROVIDER_SITE_OTHER): Payer: Self-pay | Admitting: Vascular Surgery

## 2016-07-19 ENCOUNTER — Ambulatory Visit (INDEPENDENT_AMBULATORY_CARE_PROVIDER_SITE_OTHER): Payer: Medicare Other | Admitting: Vascular Surgery

## 2016-07-19 ENCOUNTER — Ambulatory Visit (INDEPENDENT_AMBULATORY_CARE_PROVIDER_SITE_OTHER): Payer: Medicare Other

## 2016-07-19 DIAGNOSIS — M1990 Unspecified osteoarthritis, unspecified site: Secondary | ICD-10-CM | POA: Insufficient documentation

## 2016-07-19 DIAGNOSIS — I773 Arterial fibromuscular dysplasia: Secondary | ICD-10-CM

## 2016-07-19 DIAGNOSIS — I10 Essential (primary) hypertension: Secondary | ICD-10-CM

## 2016-07-19 DIAGNOSIS — M159 Polyosteoarthritis, unspecified: Secondary | ICD-10-CM

## 2016-07-19 DIAGNOSIS — M797 Fibromyalgia: Secondary | ICD-10-CM | POA: Diagnosis not present

## 2016-07-19 DIAGNOSIS — M150 Primary generalized (osteo)arthritis: Secondary | ICD-10-CM | POA: Diagnosis not present

## 2016-07-19 NOTE — Progress Notes (Signed)
MRN : [id number]  Christina Decker is a 46 y.o. (November 28, 1970) female who presents with chief complaint of  Chief Complaint  Patient presents with  . Follow-up  .  History of Present Illness: The patient is seen for follow up evaluation of carotid stenosis. In the past there is a concern for FMD of the carotid which has been asymptomatic.The carotid stenosis followed by ultrasound. Initall the stenosis was identified after a syncope spell which lead to ordering a Duplex ultrasound.  She denies any syncope since her last visit.  The patient denies amaurosis fugax. There is no recent history of TIA symptoms or focal motor deficits. There is no prior documented CVA.  The patient is taking enteric-coated aspirin 81 mg daily.  There is no history of migraine headaches. There is no history of seizures.  The patient has a history of coronary artery disease, no recent episodes of angina or shortness of breath. The patient denies PAD or claudication symptoms. There is a history of hyperlipidemia which is being treated with a statin.    Carotid Duplex done today shows <30% bilateral ICA stenosis.  On this study the previous mild to moderate velocity increase is not noted.    Duplex ultrasound of the renal arteries is normal, no evidence of renal artery FMD or renal artery stenosis.  Current Meds  Medication Sig  . amLODipine (NORVASC) 5 MG tablet Take 5 mg by mouth daily.  Marland Kitchen aspirin EC 81 MG tablet Take 81 mg by mouth daily.  . cyclobenzaprine (FLEXERIL) 10 MG tablet Take 10 mg by mouth 3 (three) times daily as needed. For muscle spasms  . diazepam (VALIUM) 10 MG tablet Take 10 mg by mouth every 6 (six) hours as needed for anxiety.   . DULoxetine (CYMBALTA) 60 MG capsule Take 60 mg by mouth daily. *May take an additional dose if needed*  . gabapentin (NEURONTIN) 300 MG capsule Take 1 capsule (300 mg total) by mouth 2 (two) times daily.  . hydrochlorothiazide (HYDRODIURIL) 12.5 MG tablet Take  12.5 mg by mouth daily.  Marland Kitchen HYDROcodone-acetaminophen (NORCO/VICODIN) 5-325 MG per tablet Take 1 tablet by mouth every 4 (four) hours as needed.  Marland Kitchen lisinopril (PRINIVIL,ZESTRIL) 40 MG tablet Take 40 mg by mouth daily.  Marland Kitchen MAGNESIUM PO Take 1 tablet by mouth daily.  . methylphenidate (RITALIN) 20 MG tablet Take 20 mg by mouth daily as needed (when working/driving).   . metoprolol (LOPRESSOR) 100 MG tablet Take 100 mg by mouth 2 (two) times daily.  . naproxen (NAPROSYN) 500 MG tablet Take 1 tablet (500 mg total) by mouth 2 (two) times daily.  Marland Kitchen nystatin (MYCOSTATIN/NYSTOP) 100000 UNIT/GM POWD Apply 1 application topically 2 (two) times daily.  . Vitamin D, Ergocalciferol, (DRISDOL) 50000 UNITS CAPS capsule Take 1 capsule by mouth every 6 (six) months.     Past Medical History:  Diagnosis Date  . Abnormal heart rhythms   . Abnormal thyroid stimulating hormone level   . Anxiety   . Depression   . Diabetes mellitus without complication ([place])   . Dyslipidemia   . Dyspareunia due to medical condition in female   . Fatty liver disease, nonalcoholic   . Fibrocystic breast disease   . Fibromuscular hyperplasia ([place])   . Fibromyalgia   . Hemorrhoid   . Hx of adenomatous colonic polyps   . Hyperkalemia   . Hypertension   . IBS (irritable bowel syndrome)   . Low back pain   . Migraine   .  Narcolepsy   . Narcolepsy and cataplexy   . Pain in left arm   . Pain in left shoulder   . Syncope   . Thyroid disease   . Vitamin D deficiency     Past Surgical History:  Procedure Laterality Date  . ABDOMINAL HYSTERECTOMY    . HEMORRHOID SURGERY    . HERNIA REPAIR      Social History Social History  Substance Use Topics  . Smoking status: Never Smoker  . Smokeless tobacco: Never Used  . Alcohol use Yes     Comment: occasional    Family History No family history on file. No family history of bleeding/clotting disorders, porphyria or autoimmune disease   No Known Allergies   REVIEW OF  SYSTEMS (Negative unless checked)  Constitutional: [] Weight loss  [] Fever  [] Chills Cardiac: [] Chest pain   [] Chest pressure   [] Palpitations   [] Shortness of breath when laying flat   [] Shortness of breath with exertion. Vascular:  [] Pain in legs with walking   [] Pain in legs at rest  [] History of DVT   [] Phlebitis   [x] Swelling in legs   [] Varicose veins   [] Non-healing ulcers Pulmonary:   [] Uses home oxygen   [] Productive cough   [] Hemoptysis   [] Wheeze  [] COPD   [] Asthma Neurologic:  [] Dizziness   [] Seizures   [] History of stroke   [] History of TIA  [] Aphasia   [] Vissual changes   [] Weakness or numbness in arm   [] Weakness or numbness in leg Musculoskeletal:   [] Joint swelling   [x] Joint pain   [] Low back pain Hematologic:  [] Easy bruising  [] Easy bleeding   [] Hypercoagulable state   [] Anemic Gastrointestinal:  [] Diarrhea   [] Vomiting  [] Gastroesophageal reflux/heartburn   [] Difficulty swallowing. Genitourinary:  [] Chronic kidney disease   [] Difficult urination  [] Frequent urination   [] Blood in urine Skin:  [] Rashes   [] Ulcers  Psychological:  [] History of anxiety   []  History of major depression.  Physical Examination  Vitals:   07/19/16 1041  BP: 123/80  Pulse: 73  Resp: 16  Weight: 211 lb (95.7 kg)  Height: 5\' 5"  (1.651 m)   Body mass index is 35.11 kg/m. Gen: WD/WN, NAD Head: [place]/AT, No temporalis wasting.  Ear/Nose/Throat: Hearing grossly intact, nares w/o erythema or drainage, poor dentition Eyes: PER, EOMI, sclera nonicteric.  Neck: Supple, no masses.  No bruit or JVD.  Pulmonary:  Good air movement, clear to auscultation bilaterally, no use of accessory muscles.  Cardiac: RRR, normal S1, S2, no Murmurs. Vascular:  Vessel Right Left  Radial Palpable Palpable  Ulnar Palpable Palpable  Brachial Palpable Palpable  Carotid Palpable Palpable  Femoral Palpable Palpable  Popliteal Palpable Palpable  PT Palpable Palpable  DP Palpable Palpable   Gastrointestinal: soft,  non-distended. No guarding/no peritoneal signs.  Musculoskeletal: M/S 5/5 throughout.  No deformity or atrophy.  Neurologic: CN 2-12 intact. Pain and light touch intact in extremities.  Symmetrical.  Speech is fluent. Motor exam as listed above. Psychiatric: Judgment intact, Mood & affect appropriate for pt's clinical situation. Dermatologic: No rashes or ulcers noted.  No changes consistent with cellulitis. Lymph : No Cervical lymphadenopathy, no lichenification or skin changes of chronic lymphedema.  CBC Lab Results  Component Value Date   WBC 12.3 (H) 01/18/2014   HGB 13.3 03/27/2016   HCT 39.0 03/27/2016   MCV 86.7 01/18/2014   PLT 343 01/18/2014    BMET    Component Value Date/Time   NA 137 03/27/2016 0407   NA 137  05/11/2013 1206   K 3.8 03/27/2016 0407   K 4.5 05/11/2013 1433   CL 104 03/27/2016 0407   CL 107 05/11/2013 1206   CO2 26 01/18/2014 2032   CO2 25 05/11/2013 1206   GLUCOSE 111 (H) 03/27/2016 0407   GLUCOSE 122 (H) 05/11/2013 1206   BUN 9 03/27/2016 0407   BUN 12 05/11/2013 1206   CREATININE 0.90 03/27/2016 0407   CREATININE 1.13 05/11/2013 1206   CALCIUM 9.1 01/18/2014 2032   CALCIUM 8.9 05/11/2013 1206   GFRNONAA >90 01/18/2014 2032   GFRNONAA 59 (L) 05/11/2013 1206   GFRAA >90 01/18/2014 2032   GFRAA >60 05/11/2013 1206   CrCl cannot be calculated (Patient's most recent lab result is older than the maximum 21 days allowed.).  COAG No results found for: INR, PROTIME  Radiology No results found.   Assessment/Plan 1. Fibromuscular dysplasia (HCC) Recommend:  Given the patient's asymptomatic subcritical stenosis no further invasive testing or surgery at this time.  Duplex ultrasound shows minimal stenosis bilaterally.  Continue antiplatelet therapy as prescribed Continue management of CAD, HTN and Hyperlipidemia Healthy heart diet,  encouraged exercise at least 4 times per week Follow up PRN given the minimal carotid disease and the lack of  evidence in the carotid and renal arteries of hemodynamically significant stenosis.  2. Essential hypertension Continue antihypertensive medications as already ordered, these medications have been reviewed and there are no changes at this time.  3. Fibromyalgia Continue analgesic medications as already ordered, these medications have been reviewed and there are no changes at this time.  4. Primary osteoarthritis involving multiple joints Continue NSAID medications as already ordered, these medications have been reviewed and there are no changes at this time.   Hortencia Pilar, MD  07/19/2016 9:00 PM

## 2016-11-28 ENCOUNTER — Other Ambulatory Visit: Payer: Self-pay | Admitting: Nurse Practitioner

## 2016-11-28 DIAGNOSIS — Z1289 Encounter for screening for malignant neoplasm of other sites: Secondary | ICD-10-CM

## 2017-03-24 ENCOUNTER — Emergency Department (HOSPITAL_COMMUNITY)
Admission: EM | Admit: 2017-03-24 | Discharge: 2017-03-24 | Disposition: A | Discharge status: Home / Self Care | Payer: Medicare Other | Attending: Emergency Medicine | Admitting: Emergency Medicine

## 2017-03-24 ENCOUNTER — Encounter (HOSPITAL_COMMUNITY): Payer: Self-pay | Admitting: Emergency Medicine

## 2017-03-24 DIAGNOSIS — Z7982 Long term (current) use of aspirin: Secondary | ICD-10-CM | POA: Diagnosis not present

## 2017-03-24 DIAGNOSIS — E119 Type 2 diabetes mellitus without complications: Secondary | ICD-10-CM | POA: Insufficient documentation

## 2017-03-24 DIAGNOSIS — I10 Essential (primary) hypertension: Secondary | ICD-10-CM | POA: Diagnosis not present

## 2017-03-24 DIAGNOSIS — L739 Follicular disorder, unspecified: Secondary | ICD-10-CM | POA: Insufficient documentation

## 2017-03-24 DIAGNOSIS — N9089 Other specified noninflammatory disorders of vulva and perineum: Secondary | ICD-10-CM | POA: Insufficient documentation

## 2017-03-24 DIAGNOSIS — Z79899 Other long term (current) drug therapy: Secondary | ICD-10-CM | POA: Diagnosis not present

## 2017-03-24 DIAGNOSIS — N898 Other specified noninflammatory disorders of vagina: Secondary | ICD-10-CM | POA: Diagnosis present

## 2017-03-24 LAB — URINALYSIS, ROUTINE W REFLEX MICROSCOPIC
GLUCOSE, UA: NEGATIVE mg/dL
HGB URINE DIPSTICK: NEGATIVE
KETONES UR: NEGATIVE mg/dL
LEUKOCYTES UA: NEGATIVE
NITRITE: NEGATIVE
PROTEIN: 30 mg/dL — AB
Specific Gravity, Urine: 1.033 — ABNORMAL HIGH (ref 1.005–1.030)
pH: 5 (ref 5.0–8.0)

## 2017-03-24 MED ORDER — SULFAMETHOXAZOLE-TRIMETHOPRIM 800-160 MG PO TABS: 1.0000 | ORAL_TABLET | Freq: Two times a day (BID) | ORAL | 0 refills | Status: AC

## 2017-03-24 NOTE — ED Triage Notes (Signed)
Pt states that she has two red bumps on her left and right vulva x 1 week.  She states that they are sore and itching.  Pt denies fevers, chills, nausea, vomiting, and diarrhea.

## 2017-03-24 NOTE — Discharge Instructions (Signed)
Warm wet compresses or soaks 2-3 times a day. Take the antibiotic as directed until it's finished. Avoid shaving the area until the affected areas have healed. Return here for any worsening symptoms

## 2017-03-24 NOTE — ED Notes (Signed)
This RN went into d/c pt, pt not in room.

## 2017-03-25 NOTE — ED Notes (Signed)
Pt's d/c instructions and prescription at nurse's station.  Called pt to verify if she received a copy but no answer.  Left message for pt to call back to [phone].

## 2017-03-25 NOTE — ED Notes (Signed)
Pt called back and says will come later today and pick up her d/c instructions.

## 2017-03-29 NOTE — ED Provider Notes (Signed)
[facility] DEPT Provider Note   CSN: [id number] Arrival date & time: 03/24/17  1622     History   Chief Complaint Chief Complaint  Patient presents with  . Vaginal Itching    HPI Christina Decker is a 46 y.o. female.  HPI  Christina Decker is a 46 y.o. female who presents to the Emergency Department complaining of painful, indurated nodules to the left and right vulva.  Present for one week.  Noticed nodule to the left initially.  Admits to recent shaving.  Describes pain with palpation and itching.  Denies vaginal d/c, new sexual partners, abdominal pain, fever, vomiting, nausea.  Past Medical History:  Diagnosis Date  . Abnormal heart rhythms   . Abnormal thyroid stimulating hormone level   . Anxiety   . Depression   . Diabetes mellitus without complication ([place])   . Dyslipidemia   . Dyspareunia due to medical condition in female   . Fatty liver disease, nonalcoholic   . Fibrocystic breast disease   . Fibromuscular hyperplasia ([place])   . Fibromyalgia   . Hemorrhoid   . Hx of adenomatous colonic polyps   . Hyperkalemia   . Hypertension   . IBS (irritable bowel syndrome)   . Low back pain   . Migraine   . Narcolepsy   . Narcolepsy and cataplexy   . Pain in left arm   . Pain in left shoulder   . Syncope   . Thyroid disease   . Vitamin D deficiency     Patient Active Problem List   Diagnosis Date Noted  . Essential hypertension 07/19/2016  . Fibromyalgia 07/19/2016  . Fibromuscular dysplasia ([place]) 07/19/2016  . DJD (degenerative joint disease) 07/19/2016    Past Surgical History:  Procedure Laterality Date  . ABDOMINAL HYSTERECTOMY    . HEMORRHOID SURGERY    . HERNIA REPAIR      OB History    No data available       Home Medications    Prior to Admission medications   Medication Sig Start Date End Date Taking? Authorizing Provider  acetaminophen (TYLENOL) 500 MG tablet Take 500 mg by mouth every 6 (six) hours as needed for mild pain or moderate  pain.    [provider]  amLODipine (NORVASC) 5 MG tablet Take 5 mg by mouth daily. 01/12/14   [provider]  aspirin EC 81 MG tablet Take 81 mg by mouth daily.    [provider]  cyclobenzaprine (FLEXERIL) 10 MG tablet Take 10 mg by mouth 3 (three) times daily as needed. For muscle spasms    [provider]  diazepam (VALIUM) 10 MG tablet Take 10 mg by mouth every 6 (six) hours as needed for anxiety.  09/25/13   [provider]  DULoxetine (CYMBALTA) 60 MG capsule Take 60 mg by mouth daily. *May take an additional dose if needed* 12/21/13   [provider]  gabapentin (NEURONTIN) 300 MG capsule Take 1 capsule (300 mg total) by mouth 2 (two) times daily. 03/27/16   Horton, Barbette Hair, MD  hydrochlorothiazide (HYDRODIURIL) 12.5 MG tablet Take 12.5 mg by mouth daily. 01/17/14   [provider]  HYDROcodone-acetaminophen (NORCO/VICODIN) 5-325 MG per tablet Take 1 tablet by mouth every 4 (four) hours as needed. 01/18/14   Tanna Furry, MD  lisinopril (PRINIVIL,ZESTRIL) 40 MG tablet Take 40 mg by mouth daily. 01/17/14   [provider]  MAGNESIUM PO Take 1 tablet by mouth daily.    [provider]  methylphenidate (RITALIN) 20 MG tablet Take 20 mg by mouth daily as needed (when working/driving).  12/08/13   [provider]  metoprolol (LOPRESSOR) 100 MG tablet Take 100 mg by mouth 2 (two) times daily. 12/21/13   [provider]  naproxen (NAPROSYN) 500 MG tablet Take 1 tablet (500 mg total) by mouth 2 (two) times daily. 03/27/16   Horton, Barbette Hair, MD  nystatin (MYCOSTATIN/NYSTOP) 100000 UNIT/GM POWD Apply 1 application topically 2 (two) times daily. 01/06/14   [provider]  sulfamethoxazole-trimethoprim (BACTRIM DS,SEPTRA DS) 800-160 MG tablet Take 1 tablet by mouth 2 (two) times daily. 03/24/17 03/31/17  Linn Goetze, PA-C  Vitamin D, Ergocalciferol, (DRISDOL) 50000 UNITS CAPS capsule Take 1 capsule  by mouth every 6 (six) months.     [provider]    Family History No family history on file.  Social History Social History  Substance Use Topics  . Smoking status: Never Smoker  . Smokeless tobacco: Never Used  . Alcohol use Yes     Comment: occasional     Allergies   Patient has no known allergies.   Review of Systems Review of Systems  Constitutional: Negative.  Negative for chills and fever.  Eyes: Negative.   Cardiovascular: Negative for chest pain.  Gastrointestinal: Negative for abdominal pain, nausea and vomiting.  Genitourinary: Positive for genital sores. Negative for difficulty urinating, dysuria, frequency, hematuria, vaginal bleeding and vaginal discharge.  Musculoskeletal: Negative for back pain and neck pain.  Skin: Negative for color change and rash.  Neurological: Negative for dizziness and headaches.  Hematological: Negative for adenopathy. Does not bruise/bleed easily.  Psychiatric/Behavioral: The patient is not nervous/anxious.      Physical Exam Updated Vital Signs BP 131/87 (BP Location: Right Arm)   Pulse 84   Temp 99.3 F (37.4 C) (Oral)   Resp 18   Ht 5\' 5"  (1.651 m)   Wt 91.6 kg (202 lb)   SpO2 97%   BMI 33.61 kg/m   Physical Exam  Constitutional: She is oriented to person, place, and time. She appears well-developed and well-nourished. No distress.  HENT:  Head: Atraumatic.  Neck: Normal range of motion.  Cardiovascular: Normal rate, regular rhythm, normal heart sounds and intact distal pulses.   Pulmonary/Chest: Effort normal and breath sounds normal. No respiratory distress.  Abdominal: Soft. She exhibits no distension.  Genitourinary:  Genitourinary Comments: Pea sized nodule to the bilateral vulva.  No surrounding erythema. No fluctuance or vesicles.  Vaginal area is shaved.   Musculoskeletal: Normal range of motion.  Neurological: She is alert and oriented to person, place, and time.  Skin: Skin is warm. Capillary  refill takes less than 2 seconds. No rash noted.  Psychiatric: She has a normal mood and affect.  Nursing note and vitals reviewed.    ED Treatments / Results  Labs (all labs ordered are listed, but only abnormal results are displayed) Labs Reviewed  URINALYSIS, ROUTINE W REFLEX MICROSCOPIC - Abnormal; Notable for the following:       Result Value   APPearance HAZY (*)    Specific Gravity, Urine 1.033 (*)    Bilirubin Urine SMALL (*)    Protein, ur 30 (*)    Bacteria, UA RARE (*)    Squamous Epithelial / LPF 0-5 (*)    All other components within normal limits  GC/CHLAMYDIA PROBE AMP ([place]) NOT AT [facility]    EKG  EKG Interpretation None       Radiology  No results found.  Procedures Procedures (including critical care time)  Medications Ordered in ED Medications - No data to display   Initial Impression / Assessment and Plan / ED Course  I have reviewed the triage vital signs and the nursing notes.  Pertinent labs & imaging results that were available during my care of the patient were reviewed by me and considered in my medical decision making (see chart for details).     GC chlamydia tests obtained through u/a.  Nodules appear c/w folliculitis.  Doubt herpes.    Final Clinical Impressions(s) / ED Diagnoses   Final diagnoses:  Folliculitis    New Prescriptions Discharge Medication List as of 03/24/2017  7:20 PM    START taking these medications   Details  sulfamethoxazole-trimethoprim (BACTRIM DS,SEPTRA DS) 800-160 MG tablet Take 1 tablet by mouth 2 (two) times daily., Starting Sun 03/24/2017, Until Sun 03/31/2017, Print         Elfrida Pixley, PA-C 03/29/17 2040    Nat Christen, MD 03/30/17 1257

## 2017-10-09 ENCOUNTER — Other Ambulatory Visit: Payer: Self-pay

## 2017-10-09 ENCOUNTER — Encounter
Admission: RE | Admit: 2017-10-09 | Discharge: 2017-10-09 | Disposition: A | Discharge status: Home / Self Care | Payer: Medicare Other | Source: Ambulatory Visit | Attending: Neurosurgery | Admitting: Neurosurgery

## 2017-10-09 DIAGNOSIS — Z01818 Encounter for other preprocedural examination: Secondary | ICD-10-CM | POA: Insufficient documentation

## 2017-10-09 DIAGNOSIS — I517 Cardiomegaly: Secondary | ICD-10-CM | POA: Diagnosis not present

## 2017-10-09 HISTORY — DX: Localized edema: R60.0

## 2017-10-09 HISTORY — DX: Cardiac arrhythmia, unspecified: I49.9

## 2017-10-09 HISTORY — DX: Cervical disc disorder, unspecified, unspecified cervical region: M50.90

## 2017-10-09 HISTORY — DX: Other allergic rhinitis: J30.89

## 2017-10-09 HISTORY — DX: Other abnormalities of gait and mobility: R26.89

## 2017-10-09 LAB — BASIC METABOLIC PANEL
ANION GAP: 8 (ref 5–15)
BUN: 10 mg/dL (ref 6–20)
CO2: 27 mmol/L (ref 22–32)
Calcium: 9.1 mg/dL (ref 8.9–10.3)
Chloride: 103 mmol/L (ref 101–111)
Creatinine, Ser: 0.73 mg/dL (ref 0.44–1.00)
GFR calc Af Amer: >60 mL/min (ref >60)
GFR calc non Af Amer: >60 mL/min (ref >60)
GLUCOSE: 92 mg/dL (ref 65–99)
POTASSIUM: 3.3 mmol/L — AB (ref 3.5–5.1)
Sodium: 138 mmol/L (ref 135–145)

## 2017-10-09 LAB — PROTIME-INR
INR: 0.94
PROTHROMBIN TIME: 12.5 s (ref 11.4–15.2)

## 2017-10-09 LAB — URINALYSIS, COMPLETE (UACMP) WITH MICROSCOPIC
BACTERIA UA: NONE SEEN
Bilirubin Urine: NEGATIVE
Glucose, UA: NEGATIVE mg/dL
Hgb urine dipstick: NEGATIVE
Ketones, ur: NEGATIVE mg/dL
Leukocytes, UA: NEGATIVE
Nitrite: NEGATIVE
Protein, ur: 30 mg/dL — AB
SPECIFIC GRAVITY, URINE: 1.025 (ref 1.005–1.030)
pH: 6 (ref 5.0–8.0)

## 2017-10-09 LAB — SURGICAL PCR SCREEN
MRSA, PCR: NEGATIVE
Staphylococcus aureus: NEGATIVE

## 2017-10-09 LAB — TYPE AND SCREEN
ABO/RH(D): B POS
ANTIBODY SCREEN: NEGATIVE

## 2017-10-09 LAB — CBC
HEMATOCRIT: 39.7 % (ref 35.0–47.0)
Hemoglobin: 13.3 g/dL (ref 12.0–16.0)
MCH: 29.4 pg (ref 26.0–34.0)
MCHC: 33.4 g/dL (ref 32.0–36.0)
MCV: 87.8 fL (ref 80.0–100.0)
Platelets: 444 10*3/uL — ABNORMAL HIGH (ref 150–440)
RBC: 4.52 MIL/uL (ref 3.80–5.20)
RDW: 13.7 % (ref 11.5–14.5)
WBC: 13.4 10*3/uL — AB (ref 3.6–11.0)

## 2017-10-09 LAB — APTT: APTT: 28 s (ref 24–36)

## 2017-10-09 NOTE — Patient Instructions (Signed)
Your procedure is scheduled on: 10/16/17 Wed Report to Same Day Surgery 2nd floor medical mall [facility] Entrance-take elevator on left to 2nd floor.  Check in with surgery information desk.) To find out your arrival time please call [phone] between 1PM - 3PM on 10/15/17 Tues  Remember: Instructions that are not followed completely may result in serious medical risk, up to and including death, or upon the discretion of your surgeon and anesthesiologist your surgery may need to be rescheduled.    _x___ 1. Do not eat food after midnight the night before your procedure. You may drink clear liquids up to 2 hours before you are scheduled to arrive at the hospital for your procedure.  Do not drink clear liquids within 2 hours of your scheduled arrival to the hospital.  Clear liquids include  --Water or Apple juice without pulp  --Clear carbohydrate beverage such as ClearFast or Gatorade  --Black Coffee or Clear Tea (No milk, no creamers, do not add anything to                  the coffee or Tea Type 1 and type 2 diabetics should only drink water.  No gum chewing or hard candies.     __x__ 2. No Alcohol for 24 hours before or after surgery.   __x__3. No Smoking or e-cigarettes for 24 prior to surgery.  Do not use any chewable tobacco products for at least 6 hour prior to surgery   ____  4. Bring all medications with you on the day of surgery if instructed.    __x__ 5. Notify your doctor if there is any change in your medical condition     (cold, fever, infections).    x___6. On the morning of surgery brush your teeth with toothpaste and water.  You may rinse your mouth with mouth wash if you wish.  Do not swallow any toothpaste or mouthwash.   Do not wear jewelry, make-up, hairpins, clips or nail polish.  Do not wear lotions, powders, or perfumes. You may wear deodorant.  Do not shave 48 hours prior to surgery. Men may shave face and neck.  Do not bring valuables to the hospital.     [facility] is not responsible for any belongings or valuables.               Contacts, dentures or bridgework may not be worn into surgery.  Leave your suitcase in the car. After surgery it may be brought to your room.  For patients admitted to the hospital, discharge time is determined by your                       treatment team.  _  Patients discharged the day of surgery will not be allowed to drive home.  You will need someone to drive you home and stay with you the night of your procedure.    Please read over the following fact sheets that you were given:   [facility] Preparing for Surgery and or MRSA Information   _x___ Take anti-hypertensive listed below, cardiac, seizure, asthma,     anti-reflux and psychiatric medicines. These include:  1. amLODipine (NORVASC) 5 MG tablet  2.DULoxetine (CYMBALTA) 60 MG capsule  3.metFORMIN (GLUCOPHAGE-XR) 500 MG 24 hr tablet  4.metoprolol (LOPRESSOR) 100 MG tablet  5.Milnacipran HCl (SAVELLA) 25 MG TABS  6.  ____Fleets enema or Magnesium Citrate as directed.   _x___ Use CHG Soap or sage wipes  as directed on instruction sheet   ____ Use inhalers on the day of surgery and bring to hospital day of surgery  __x__ Stop Metformin and Janumet 2 days prior to surgery.    ____ Take 1/2 of usual insulin dose the night before surgery and none on the morning     surgery.   _x___ Follow recommendations from Cardiologist, Pulmonologist or PCP regarding          stopping Aspirin, Coumadin, Plavix ,Eliquis, Effient, or Pradaxa, and Pletal. Stop Aspirin today if OK with physician  X____Stop Anti-inflammatories such as Advil, Aleve, Ibuprofen, Motrin, Naproxen, Naprosyn, Goodies powders or aspirin products. OK to take Tylenol and                          Celebrex.   _x___ Stop supplements until after surgery.  But may continue Vitamin D, Vitamin B,       and multivitamin.   ____ Bring C-Pap to the hospital.

## 2017-10-16 ENCOUNTER — Other Ambulatory Visit: Payer: Self-pay

## 2017-10-16 ENCOUNTER — Observation Stay
Admission: RE | Admit: 2017-10-16 | Discharge: 2017-10-17 | Disposition: A | Discharge status: Home / Self Care | Payer: Medicare Other | Source: Ambulatory Visit | Attending: Neurosurgery | Admitting: Neurosurgery

## 2017-10-16 ENCOUNTER — Encounter
Admission: RE | Disposition: A | Discharge status: Home / Self Care | Payer: Self-pay | Source: Ambulatory Visit | Attending: Neurosurgery

## 2017-10-16 ENCOUNTER — Observation Stay: Payer: Medicare Other

## 2017-10-16 ENCOUNTER — Inpatient Hospital Stay: Payer: Medicare Other | Admitting: Certified Registered Nurse Anesthetist

## 2017-10-16 ENCOUNTER — Encounter: Payer: Self-pay | Admitting: Certified Registered Nurse Anesthetist

## 2017-10-16 ENCOUNTER — Inpatient Hospital Stay: Payer: Medicare Other

## 2017-10-16 DIAGNOSIS — F419 Anxiety disorder, unspecified: Secondary | ICD-10-CM | POA: Diagnosis not present

## 2017-10-16 DIAGNOSIS — E119 Type 2 diabetes mellitus without complications: Secondary | ICD-10-CM | POA: Insufficient documentation

## 2017-10-16 DIAGNOSIS — Z419 Encounter for procedure for purposes other than remedying health state, unspecified: Secondary | ICD-10-CM

## 2017-10-16 DIAGNOSIS — G959 Disease of spinal cord, unspecified: Secondary | ICD-10-CM | POA: Diagnosis present

## 2017-10-16 DIAGNOSIS — M4802 Spinal stenosis, cervical region: Secondary | ICD-10-CM | POA: Diagnosis present

## 2017-10-16 DIAGNOSIS — K760 Fatty (change of) liver, not elsewhere classified: Secondary | ICD-10-CM | POA: Diagnosis not present

## 2017-10-16 DIAGNOSIS — I10 Essential (primary) hypertension: Secondary | ICD-10-CM | POA: Diagnosis not present

## 2017-10-16 DIAGNOSIS — F329 Major depressive disorder, single episode, unspecified: Secondary | ICD-10-CM | POA: Insufficient documentation

## 2017-10-16 DIAGNOSIS — Z981 Arthrodesis status: Secondary | ICD-10-CM

## 2017-10-16 DIAGNOSIS — Z79899 Other long term (current) drug therapy: Secondary | ICD-10-CM | POA: Diagnosis not present

## 2017-10-16 DIAGNOSIS — Z7984 Long term (current) use of oral hypoglycemic drugs: Secondary | ICD-10-CM | POA: Insufficient documentation

## 2017-10-16 HISTORY — PX: ANTERIOR CERVICAL DECOMP/DISCECTOMY FUSION: SHX1161

## 2017-10-16 LAB — GLUCOSE, CAPILLARY
GLUCOSE-CAPILLARY: 119 mg/dL — AB (ref 65–99)
Glucose-Capillary: 119 mg/dL — ABNORMAL HIGH (ref 65–99)
Glucose-Capillary: 160 mg/dL — ABNORMAL HIGH (ref 65–99)
Glucose-Capillary: 203 mg/dL — ABNORMAL HIGH (ref 65–99)

## 2017-10-16 LAB — POCT I-STAT 4, (NA,K, GLUC, HGB,HCT)
Glucose, Bld: 130 mg/dL — ABNORMAL HIGH (ref 65–99)
HCT: 39 % (ref 36.0–46.0)
Hemoglobin: 13.3 g/dL (ref 12.0–15.0)
Potassium: 3.2 mmol/L — ABNORMAL LOW (ref 3.5–5.1)
SODIUM: 140 mmol/L (ref 135–145)

## 2017-10-16 LAB — ABO/RH: ABO/RH(D): B POS

## 2017-10-16 SURGERY — ANTERIOR CERVICAL DECOMPRESSION/DISCECTOMY FUSION 2 LEVELS
Anesthesia: General | Site: Neck | Wound class: Clean

## 2017-10-16 MED ORDER — BACITRACIN 50000 UNITS IM SOLR
INTRAMUSCULAR | Status: AC
  Filled 2017-10-16: qty 1

## 2017-10-16 MED ORDER — METHOCARBAMOL 1000 MG/10ML IJ SOLN
500.0000 mg | Freq: Four times a day (QID) | INTRAMUSCULAR | Status: DC | PRN
  Filled 2017-10-16: qty 5

## 2017-10-16 MED ORDER — PHENYLEPHRINE HCL 10 MG/ML IJ SOLN
INTRAMUSCULAR | Status: DC | PRN
  Administered 2017-10-16: 100 ug via INTRAVENOUS
  Administered 2017-10-16: 150 ug via INTRAVENOUS
  Administered 2017-10-16: 100 ug via INTRAVENOUS
  Administered 2017-10-16: 50 ug via INTRAVENOUS

## 2017-10-16 MED ORDER — SODIUM CHLORIDE 0.9% FLUSH: 3.0000 mL | INTRAVENOUS | Status: DC | PRN

## 2017-10-16 MED ORDER — ACETAMINOPHEN 10 MG/ML IV SOLN
INTRAVENOUS | Status: DC | PRN
  Administered 2017-10-16: 1000 mg via INTRAVENOUS

## 2017-10-16 MED ORDER — INSULIN ASPART 100 UNIT/ML ~~LOC~~ SOLN
0.0000 [IU] | Freq: Three times a day (TID) | SUBCUTANEOUS | Status: DC
  Administered 2017-10-16: 3 [IU] via SUBCUTANEOUS
  Filled 2017-10-16: qty 1

## 2017-10-16 MED ORDER — DEXAMETHASONE SODIUM PHOSPHATE 10 MG/ML IJ SOLN
INTRAMUSCULAR | Status: DC | PRN
  Administered 2017-10-16: 10 mg via INTRAVENOUS

## 2017-10-16 MED ORDER — LISINOPRIL 20 MG PO TABS
40.0000 mg | ORAL_TABLET | Freq: Every day | ORAL | Status: DC
  Administered 2017-10-16 – 2017-10-17 (×2): 40 mg via ORAL
  Filled 2017-10-16 (×2): qty 2

## 2017-10-16 MED ORDER — LIDOCAINE HCL (PF) 2 % IJ SOLN
INTRAMUSCULAR | Status: AC
  Filled 2017-10-16: qty 10

## 2017-10-16 MED ORDER — SUCCINYLCHOLINE CHLORIDE 20 MG/ML IJ SOLN
INTRAMUSCULAR | Status: DC | PRN
  Administered 2017-10-16: 100 mg via INTRAVENOUS

## 2017-10-16 MED ORDER — FENTANYL CITRATE (PF) 100 MCG/2ML IJ SOLN
INTRAMUSCULAR | Status: DC | PRN
  Administered 2017-10-16: 100 ug via INTRAVENOUS

## 2017-10-16 MED ORDER — SODIUM CHLORIDE 0.9% FLUSH
3.0000 mL | Freq: Two times a day (BID) | INTRAVENOUS | Status: DC
  Administered 2017-10-16 (×2): 3 mL via INTRAVENOUS

## 2017-10-16 MED ORDER — OXYCODONE HCL 5 MG PO TABS
10.0000 mg | ORAL_TABLET | ORAL | Status: DC | PRN
  Administered 2017-10-16 (×2): 10 mg via ORAL
  Filled 2017-10-16 (×2): qty 2

## 2017-10-16 MED ORDER — SODIUM CHLORIDE 0.9 % IV SOLN: 250.0000 mL | INTRAVENOUS | Status: DC

## 2017-10-16 MED ORDER — ATORVASTATIN CALCIUM 20 MG PO TABS
40.0000 mg | ORAL_TABLET | Freq: Every evening | ORAL | Status: DC
  Administered 2017-10-16: 40 mg via ORAL
  Filled 2017-10-16: qty 2

## 2017-10-16 MED ORDER — FENTANYL CITRATE (PF) 100 MCG/2ML IJ SOLN
25.0000 ug | INTRAMUSCULAR | Status: DC | PRN
  Administered 2017-10-16: 50 ug via INTRAVENOUS
  Administered 2017-10-16: 25 ug via INTRAVENOUS

## 2017-10-16 MED ORDER — MENTHOL 3 MG MT LOZG
1.0000 | LOZENGE | OROMUCOSAL | Status: DC | PRN
  Filled 2017-10-16: qty 9

## 2017-10-16 MED ORDER — PROPOFOL 500 MG/50ML IV EMUL
INTRAVENOUS | Status: AC
  Filled 2017-10-16: qty 100

## 2017-10-16 MED ORDER — ROCURONIUM BROMIDE 50 MG/5ML IV SOLN
INTRAVENOUS | Status: AC
  Filled 2017-10-16: qty 1

## 2017-10-16 MED ORDER — DULOXETINE HCL 60 MG PO CPEP
60.0000 mg | ORAL_CAPSULE | Freq: Every day | ORAL | Status: DC
  Administered 2017-10-17: 60 mg via ORAL
  Filled 2017-10-16: qty 1

## 2017-10-16 MED ORDER — SODIUM CHLORIDE 0.9 % IV SOLN
INTRAVENOUS | Status: DC
  Administered 2017-10-16 (×2): via INTRAVENOUS

## 2017-10-16 MED ORDER — DEXAMETHASONE SODIUM PHOSPHATE 10 MG/ML IJ SOLN
INTRAMUSCULAR | Status: AC
  Filled 2017-10-16: qty 1

## 2017-10-16 MED ORDER — ACETAMINOPHEN 10 MG/ML IV SOLN
INTRAVENOUS | Status: AC
  Filled 2017-10-16: qty 100

## 2017-10-16 MED ORDER — SODIUM CHLORIDE 0.9 % IV SOLN
INTRAVENOUS | Status: DC | PRN
  Administered 2017-10-16: .1 ug/kg/min via INTRAVENOUS

## 2017-10-16 MED ORDER — DIAZEPAM 5 MG PO TABS: 10.0000 mg | ORAL_TABLET | Freq: Four times a day (QID) | ORAL | Status: DC | PRN

## 2017-10-16 MED ORDER — BUPIVACAINE-EPINEPHRINE (PF) 0.5% -1:200000 IJ SOLN
INTRAMUSCULAR | Status: AC
  Filled 2017-10-16: qty 30

## 2017-10-16 MED ORDER — METHOCARBAMOL 500 MG PO TABS
500.0000 mg | ORAL_TABLET | Freq: Four times a day (QID) | ORAL | Status: DC | PRN
  Administered 2017-10-16: 500 mg via ORAL
  Filled 2017-10-16 (×2): qty 1

## 2017-10-16 MED ORDER — ONDANSETRON HCL 4 MG/2ML IJ SOLN: 4.0000 mg | Freq: Once | INTRAMUSCULAR | Status: DC | PRN

## 2017-10-16 MED ORDER — REMIFENTANIL HCL 1 MG IV SOLR
INTRAVENOUS | Status: AC
  Filled 2017-10-16: qty 1000

## 2017-10-16 MED ORDER — FENTANYL CITRATE (PF) 100 MCG/2ML IJ SOLN
INTRAMUSCULAR | Status: AC
  Filled 2017-10-16: qty 2

## 2017-10-16 MED ORDER — AMLODIPINE BESYLATE 5 MG PO TABS
5.0000 mg | ORAL_TABLET | Freq: Every day | ORAL | Status: DC
  Administered 2017-10-17: 5 mg via ORAL
  Filled 2017-10-16: qty 1

## 2017-10-16 MED ORDER — SODIUM CHLORIDE 0.9 % IR SOLN
Status: DC | PRN
  Administered 2017-10-16: 1000 mL

## 2017-10-16 MED ORDER — METOPROLOL TARTRATE 50 MG PO TABS
100.0000 mg | ORAL_TABLET | Freq: Two times a day (BID) | ORAL | Status: DC
  Administered 2017-10-16 – 2017-10-17 (×2): 100 mg via ORAL
  Filled 2017-10-16 (×2): qty 2

## 2017-10-16 MED ORDER — ONDANSETRON HCL 4 MG/2ML IJ SOLN: 4.0000 mg | Freq: Four times a day (QID) | INTRAMUSCULAR | Status: DC | PRN

## 2017-10-16 MED ORDER — MIDAZOLAM HCL 2 MG/2ML IJ SOLN
INTRAMUSCULAR | Status: AC
  Filled 2017-10-16: qty 2

## 2017-10-16 MED ORDER — PROPOFOL 10 MG/ML IV BOLUS
INTRAVENOUS | Status: AC
  Filled 2017-10-16: qty 20

## 2017-10-16 MED ORDER — PROPOFOL 500 MG/50ML IV EMUL
INTRAVENOUS | Status: AC
  Filled 2017-10-16: qty 50

## 2017-10-16 MED ORDER — OXYCODONE HCL 5 MG PO TABS: 5.0000 mg | ORAL_TABLET | ORAL | Status: DC | PRN

## 2017-10-16 MED ORDER — KETAMINE HCL 50 MG/ML IJ SOLN
INTRAMUSCULAR | Status: DC | PRN
  Administered 2017-10-16: 50 mg via INTRAMUSCULAR

## 2017-10-16 MED ORDER — CEFAZOLIN SODIUM-DEXTROSE 2-4 GM/100ML-% IV SOLN
INTRAVENOUS | Status: AC
  Filled 2017-10-16: qty 100

## 2017-10-16 MED ORDER — SENNOSIDES-DOCUSATE SODIUM 8.6-50 MG PO TABS: 1.0000 | ORAL_TABLET | Freq: Every evening | ORAL | Status: DC | PRN

## 2017-10-16 MED ORDER — LIDOCAINE HCL (CARDIAC) PF 100 MG/5ML IV SOSY
PREFILLED_SYRINGE | INTRAVENOUS | Status: DC | PRN
  Administered 2017-10-16: 80 mg via INTRAVENOUS

## 2017-10-16 MED ORDER — SUCCINYLCHOLINE CHLORIDE 20 MG/ML IJ SOLN
INTRAMUSCULAR | Status: AC
  Filled 2017-10-16: qty 1

## 2017-10-16 MED ORDER — ONDANSETRON HCL 4 MG PO TABS: 4.0000 mg | ORAL_TABLET | Freq: Four times a day (QID) | ORAL | Status: DC | PRN

## 2017-10-16 MED ORDER — ONDANSETRON HCL 4 MG/2ML IJ SOLN
INTRAMUSCULAR | Status: AC
  Filled 2017-10-16: qty 2

## 2017-10-16 MED ORDER — ACETAMINOPHEN 325 MG PO TABS: 650.0000 mg | ORAL_TABLET | ORAL | Status: DC | PRN

## 2017-10-16 MED ORDER — HYDROMORPHONE HCL 1 MG/ML IJ SOLN
0.5000 mg | INTRAMUSCULAR | Status: DC | PRN
  Administered 2017-10-16: 0.5 mg via INTRAVENOUS
  Filled 2017-10-16: qty 1

## 2017-10-16 MED ORDER — LACTATED RINGERS IV SOLN
INTRAVENOUS | Status: DC | PRN
  Administered 2017-10-16: 10:00:00 via INTRAVENOUS

## 2017-10-16 MED ORDER — CEFAZOLIN SODIUM-DEXTROSE 2-4 GM/100ML-% IV SOLN
2.0000 g | Freq: Once | INTRAVENOUS | Status: AC
  Administered 2017-10-16: 2 g via INTRAVENOUS

## 2017-10-16 MED ORDER — ACETAMINOPHEN 500 MG PO TABS
1000.0000 mg | ORAL_TABLET | Freq: Four times a day (QID) | ORAL | Status: DC
  Administered 2017-10-16 – 2017-10-17 (×3): 1000 mg via ORAL
  Filled 2017-10-16 (×3): qty 2

## 2017-10-16 MED ORDER — HYDROCHLOROTHIAZIDE 25 MG PO TABS
12.5000 mg | ORAL_TABLET | Freq: Every day | ORAL | Status: DC
  Administered 2017-10-16 – 2017-10-17 (×2): 12.5 mg via ORAL
  Filled 2017-10-16 (×2): qty 1

## 2017-10-16 MED ORDER — ACETAMINOPHEN 650 MG RE SUPP: 650.0000 mg | RECTAL | Status: DC | PRN

## 2017-10-16 MED ORDER — BUPIVACAINE-EPINEPHRINE (PF) 0.5% -1:200000 IJ SOLN
INTRAMUSCULAR | Status: DC | PRN
  Administered 2017-10-16: 5 mL

## 2017-10-16 MED ORDER — PHENOL 1.4 % MT LIQD
1.0000 | OROMUCOSAL | Status: DC | PRN
  Filled 2017-10-16: qty 177

## 2017-10-16 MED ORDER — MIDAZOLAM HCL 2 MG/2ML IJ SOLN
INTRAMUSCULAR | Status: DC | PRN
  Administered 2017-10-16: 2 mg via INTRAVENOUS

## 2017-10-16 MED ORDER — ROCURONIUM BROMIDE 100 MG/10ML IV SOLN
INTRAVENOUS | Status: DC | PRN
  Administered 2017-10-16: 5 mg via INTRAVENOUS

## 2017-10-16 MED ORDER — PROPOFOL 10 MG/ML IV BOLUS
INTRAVENOUS | Status: DC | PRN
  Administered 2017-10-16: 160 mg via INTRAVENOUS

## 2017-10-16 MED ORDER — METFORMIN HCL ER 500 MG PO TB24
500.0000 mg | ORAL_TABLET | Freq: Two times a day (BID) | ORAL | Status: DC
  Administered 2017-10-16 – 2017-10-17 (×2): 500 mg via ORAL
  Filled 2017-10-16 (×2): qty 1

## 2017-10-16 MED ORDER — FAMOTIDINE 20 MG PO TABS
ORAL_TABLET | ORAL | Status: AC
  Filled 2017-10-16: qty 1

## 2017-10-16 MED ORDER — PROPOFOL 500 MG/50ML IV EMUL
INTRAVENOUS | Status: DC | PRN
  Administered 2017-10-16: 120 ug/kg/min via INTRAVENOUS

## 2017-10-16 MED ORDER — MILNACIPRAN HCL 25 MG PO TABS: 25.0000 mg | ORAL_TABLET | Freq: Two times a day (BID) | ORAL | Status: DC

## 2017-10-16 MED ORDER — FAMOTIDINE 20 MG PO TABS
20.0000 mg | ORAL_TABLET | Freq: Once | ORAL | Status: AC
  Administered 2017-10-16: 20 mg via ORAL

## 2017-10-16 MED ORDER — SODIUM CHLORIDE FLUSH 0.9 % IV SOLN
INTRAVENOUS | Status: AC
  Filled 2017-10-16: qty 10

## 2017-10-16 MED ORDER — SODIUM CHLORIDE 0.9 % IV SOLN
INTRAVENOUS | Status: DC
  Administered 2017-10-16 (×2): via INTRAVENOUS

## 2017-10-16 MED ORDER — ONDANSETRON HCL 4 MG/2ML IJ SOLN
INTRAMUSCULAR | Status: DC | PRN
  Administered 2017-10-16: 4 mg via INTRAVENOUS

## 2017-10-16 SURGICAL SUPPLY — 73 items
ADH SKN CLS APL DERMABOND .7 (GAUZE/BANDAGES/DRESSINGS) ×1
AGENT HMST MTR 8 SURGIFLO (HEMOSTASIS)
BLADE SURG 15 STRL LF DISP TIS (BLADE) ×1 IMPLANT
BLADE SURG 15 STRL SS (BLADE) ×2
BUR NEURO DRILL SOFT 3.0X3.8M (BURR) ×2 IMPLANT
CANISTER SUCT 1200ML W/VALVE (MISCELLANEOUS) ×4 IMPLANT
CAP END 15X12 XCORE MINI 7 BTM (Cap) IMPLANT
CAP END 15X12 XCORE MINI 7 TOP (Cap) IMPLANT
CHLORAPREP W/TINT 26ML (MISCELLANEOUS) ×4 IMPLANT
CORE XCORE MINI 012MM 15-20MM (Cage) ×2 IMPLANT
COUNTER NEEDLE 20/40 LG (NEEDLE) ×2 IMPLANT
COVER LIGHT HANDLE STERIS (MISCELLANEOUS) ×4 IMPLANT
CRADLE LAMINECT ARM (MISCELLANEOUS) ×2 IMPLANT
CUP MEDICINE 2OZ PLAST GRAD ST (MISCELLANEOUS) ×2 IMPLANT
DERMABOND ADVANCED (GAUZE/BANDAGES/DRESSINGS) ×1
DERMABOND ADVANCED .7 DNX12 (GAUZE/BANDAGES/DRESSINGS) ×1 IMPLANT
DRAPE C-ARM 42X72 X-RAY (DRAPES) ×4 IMPLANT
DRAPE LAPAROTOMY 77X122 PED (DRAPES) ×2 IMPLANT
DRAPE MICROSCOPE SPINE 48X150 (DRAPES) ×2 IMPLANT
DRAPE POUCH INSTRU U-SHP 10X18 (DRAPES) ×2 IMPLANT
DRAPE SURG 17X11 SM STRL (DRAPES) ×8 IMPLANT
ELECT CAUTERY BLADE TIP 2.5 (TIP) ×2
ELECT REM PT RETURN 9FT ADLT (ELECTROSURGICAL) ×2
ELECTRODE CAUTERY BLDE TIP 2.5 (TIP) ×1 IMPLANT
ELECTRODE REM PT RTRN 9FT ADLT (ELECTROSURGICAL) ×1 IMPLANT
ENDCAP XCORE MINI 15X12 7 BTM (Cap) ×1 IMPLANT
ENDCAP XCORE MINI 15X12 7 TOP (Cap) ×1 IMPLANT
FEE INTRAOP MONITOR IMPULS NCS (MISCELLANEOUS) IMPLANT
FLOSEAL 10ML (HEMOSTASIS) ×2 IMPLANT
FRAME EYE SHIELD (PROTECTIVE WEAR) ×4 IMPLANT
GLOVE BIO SURGEON STRL SZ 6.5 (GLOVE) ×4 IMPLANT
GLOVE BIOGEL PI IND STRL 7.0 (GLOVE) ×1 IMPLANT
GLOVE BIOGEL PI INDICATOR 7.0 (GLOVE) ×1
GLOVE SURG SYN 8.5  E (GLOVE) ×3
GLOVE SURG SYN 8.5 E (GLOVE) ×3 IMPLANT
GLOVE SURG SYN 8.5 PF PI (GLOVE) ×3 IMPLANT
GOWN SRG XL LVL 3 NONREINFORCE (GOWNS) ×1 IMPLANT
GOWN STRL NON-REIN TWL XL LVL3 (GOWNS) ×2
GOWN STRL REUS W/ TWL LRG LVL3 (GOWN DISPOSABLE) ×1 IMPLANT
GOWN STRL REUS W/TWL LRG LVL3 (GOWN DISPOSABLE) ×2
GOWN STRL REUS W/TWL MED LVL3 (GOWN DISPOSABLE) ×2 IMPLANT
GRADUATE 1200CC STRL 31836 (MISCELLANEOUS) ×2 IMPLANT
INTRAOP MONITOR FEE IMPULS NCS (MISCELLANEOUS) ×1
INTRAOP MONITOR FEE IMPULSE (MISCELLANEOUS) ×1
KIT TURNOVER KIT A (KITS) ×2 IMPLANT
MARKER SKIN DUAL TIP RULER LAB (MISCELLANEOUS) ×4 IMPLANT
NDL SAFETY ECLIPSE 18X1.5 (NEEDLE) ×1 IMPLANT
NEEDLE HYPO 18GX1.5 SHARP (NEEDLE) ×2
NEEDLE HYPO 22GX1.5 SAFETY (NEEDLE) ×2 IMPLANT
NS IRRIG 1000ML POUR BTL (IV SOLUTION) ×2 IMPLANT
PACK LAMINECTOMY NEURO (CUSTOM PROCEDURE TRAY) ×2 IMPLANT
PATTIES SURGICAL .5 X.5 (GAUZE/BANDAGES/DRESSINGS) ×1 IMPLANT
PIN CASPAR 14 (PIN) ×1 IMPLANT
PIN CASPAR 14MM (PIN) ×2
PLATE ARCHON 2-LEVEL 40MM (Plate) ×1 IMPLANT
SCREW ARCHON SD VAR 4.0X15MM (Screw) ×4 IMPLANT
SCREW ARCHON ST VAR 4.5X15MM (Screw) ×1 IMPLANT
SCREW SET END CAP (Screw) ×2 IMPLANT
SCREW XCORE MINI LOCK 12MM (Screw) ×1 IMPLANT
SPACER XCORE MINI 12 15-20 (Cage) IMPLANT
SPOGE SURGIFLO 8M (HEMOSTASIS)
SPONGE KITTNER 5P (MISCELLANEOUS) ×2 IMPLANT
SPONGE SURGIFLO 8M (HEMOSTASIS) ×1 IMPLANT
STAPLER SKIN PROX 35W (STAPLE) IMPLANT
SUT SILK 2 0 (SUTURE)
SUT SILK 2-0 18XBRD TIE 12 (SUTURE) IMPLANT
SUT V-LOC 90 ABS DVC 3-0 CL (SUTURE) ×2 IMPLANT
SUT VIC AB 3-0 SH 8-18 (SUTURE) ×2 IMPLANT
SYR 30ML LL (SYRINGE) ×2 IMPLANT
TAPE CLOTH 3X10 WHT NS LF (GAUZE/BANDAGES/DRESSINGS) ×2 IMPLANT
TOWEL OR 17X26 4PK STRL BLUE (TOWEL DISPOSABLE) ×6 IMPLANT
TRAY FOLEY W/METER SILVER 16FR (SET/KITS/TRAYS/PACK) IMPLANT
TUBING CONNECTING 10 (TUBING) ×2 IMPLANT

## 2017-10-16 NOTE — Anesthesia Preprocedure Evaluation (Addendum)
Anesthesia Evaluation  Patient identified by MRN, date of birth, ID band Patient awake    Reviewed: Allergy & Precautions, NPO status , Patient's Chart, lab work & pertinent test results, reviewed documented beta blocker date and time   Airway Mallampati: II  TM Distance: >3 FB     Dental  (+) Chipped, Caps   Pulmonary neg pulmonary ROS,    Pulmonary exam normal        Cardiovascular hypertension, Pt. on medications and Pt. on home beta blockers Normal cardiovascular exam+ dysrhythmias      Neuro/Psych  Headaches, PSYCHIATRIC DISORDERS Anxiety Depression    GI/Hepatic Fatty liver disease IBS   Endo/Other  diabetes, Well Controlled, Type 2, Oral Hypoglycemic Agents  Renal/GU   Female GU complaint     Musculoskeletal  (+) Arthritis , Osteoarthritis,  Fibromyalgia -  Abdominal Normal abdominal exam  (+)   Peds negative pediatric ROS (+)  Hematology negative hematology ROS (+)   Anesthesia Other Findings   Reproductive/Obstetrics                            Anesthesia Physical Anesthesia Plan  ASA: III  Anesthesia Plan: General   Post-op Pain Management:    Induction: Intravenous  PONV Risk Score and Plan:   Airway Management Planned: Oral ETT  Additional Equipment: Arterial line  Intra-op Plan:   Post-operative Plan: Extubation in OR  Informed Consent: I have reviewed the patients History and Physical, chart, labs and discussed the procedure including the risks, benefits and alternatives for the proposed anesthesia with the patient or authorized representative who has indicated his/her understanding and acceptance.     Plan Discussed with: CRNA and Surgeon  Anesthesia Plan Comments:         Anesthesia Quick Evaluation

## 2017-10-16 NOTE — Progress Notes (Signed)
Procedure: C5-C7 ACDF with C6 corpectomy Procedure Date: 10/16/2017 Diagnosis: Cervical myelopathy  History: Christina Decker is here for C5/7 acdf with C6 corpectomy for cervical myelopathy. Patient tolerated procedure well. She is seen in recovery and still disoriented from anesthesia, but she is doing well. Denies upper and lower extremity pain/numbness/tingling. She does complain of posterior neck pain.    Physical Exam: Vitals:   10/16/17 0818 10/16/17 1321  BP: (!) 135/95 (!) 144/99  Pulse: 72 80  Resp: 18 12  Temp: 98.3 F (36.8 C) (!) 96.4 F (35.8 C)  SpO2: 98% 100%    AA Ox3 Strength:5/5 throughout, except 4+/5 left hamstring Sensation: intact and symmetric upper and lower extremities.   Data:  Recent Labs  Lab 10/16/17 0826  NA 140  K 3.2*  GLUCOSE 130*   No results for input(s): AST, ALT, ALKPHOS in the last 168 hours.  Invalid input(s): TBILI   Recent Labs  Lab 10/16/17 0826  HGB 13.3  HCT 39.0   No results for input(s): APTT, INR in the last 168 hours.       Assessment/Plan:  Christina Decker is recovering following C5-C7 ACDF with C6 corpectomy. Pain she was experiencing prior to surgery seem to have resolved. She will be admitted for observation and pain control. Continue pain control with tylenol, oxycodone, robaxin PRN.  - ambulate  - post op xrays - eat - void - pain control  Marin Olp PA-C Department of Neurosurgery

## 2017-10-16 NOTE — H&P (Signed)
Heart sounds normal no MRG. Chest Clear to Auscultation Bilaterally. I have reviewed and confirmed my history and physical from 10/03/2017 with no additions or changes. Plan for C6 corpectomy and C5-7 anterior fusion with instrumentation.  Risks and benefits reviewed.

## 2017-10-16 NOTE — Progress Notes (Signed)
   10/16/17 1500  Clinical Encounter Type  Visited With Patient  Visit Type Initial;Spiritual support;Other (Comment) (HCPOA)  Referral From Nurse  Spiritual Encounters  Spiritual Needs Literature;Emotional  HCPOA/AD materials dropped off with patient.  [place] reviewed materials briefly. Patient will discuss with family.

## 2017-10-16 NOTE — Anesthesia Post-op Follow-up Note (Signed)
Anesthesia QCDR form completed.        

## 2017-10-16 NOTE — Progress Notes (Signed)
Pharmacy consulted to dose Cefazolin for surgical prophylaxis for 96 kg female.  Ordered 2 gm once dose.  Charlane Ferretti, RPh 10/16/2017

## 2017-10-16 NOTE — Transfer of Care (Signed)
Immediate Anesthesia Transfer of Care Note  Patient: Christina Decker  Procedure(s) Performed: ANTERIOR CERVICAL DECOMPRESSION/DISCECTOMY FUSION 2 LEVELS-C5-7, C6 CORPECTOMY (N/A Neck)  Patient Location: PACU  Anesthesia Type:General  Level of Consciousness: awake, alert , oriented and patient cooperative  Airway & Oxygen Therapy: Patient Spontanous Breathing and Patient connected to face mask oxygen  Post-op Assessment: Report given to RN and Post -op Vital signs reviewed and stable  Post vital signs: Reviewed and stable  Last Vitals:  Vitals Value Taken Time  BP 144/99 10/16/2017  1:22 PM  Temp    Pulse 81 10/16/2017  1:26 PM  Resp 21 10/16/2017  1:26 PM  SpO2 100 % 10/16/2017  1:26 PM  Vitals shown include unvalidated device data.  Last Pain:  Vitals:   10/16/17 1321  TempSrc:   PainSc: (P) Asleep         Complications: No apparent anesthesia complications

## 2017-10-16 NOTE — Op Note (Signed)
Indications: Christina Decker is a 47 yo female who presented cervical myelopathy due to severe cervical stenosis.  She was counseled for surgery, and asked that we proceed.  Findings: severe cervical stenosis  Preoperative Diagnosis: Cervical myelopathy Postoperative Diagnosis: same   EBL: 1000 ml IVF: 1200 ml Drains: none Disposition: Extubated and Stable to PACU Complications: none  No foley catheter was placed.   Preoperative Note:   Risks of surgery discussed include: infection, bleeding, stroke, coma, death, paralysis, CSF leak, nerve/spinal cord injury, numbness, tingling, weakness, complex regional pain syndrome, recurrent stenosis and/or disc herniation, vascular injury, development of instability, neck/back pain, need for further surgery, persistent symptoms, development of deformity, and the risks of anesthesia. The patient understood these risks and agreed to proceed.  Operative Note:  PROCEDURES: 1. Anterior cervical corpectomy of C6, rmoving greater than 50% of the vertebral body at that level. 2. Anterior Arthrodesis from C5 to C7 3. Anterior cervical spine instrumentation C5 -C7 4. Insertion of biomechanic device (Nuvasive Xcore mini) as a vertebral body replacement 5. Harvesting of autograft via the same incision 6. Use of the operative microscope for aid in microdissection  PROCEDURE IN DETAIL: After obtaining informed consent, the patient taken to the operating room, placed in supine position, general anesthesia induced.  The patient had a small shoulder roll placed behind their shoulders.  After a timeout, the patient received preop antibiotics and 10 mg of IV Decadron.  The patient had a neck incision outlined, was prepped and draped in usual sterile fashion. The incision was injected with local anesthetic.    An incision was opened, dissection taken down medial to the carotid artery and jugular vein, lateral to the trachea and esophagus.  The prevertebral fascia  identified and a localizing x-ray demonstrated the correct level.  The disc was marked with the Bovie. The soft tissues and longus colli were dissected laterally from the vertebral bodies from C5 to C7 using monopolar electrocautery. [place] retractors were then placed. The microscope was brought into the field for aid in microdissection.   With this complete, distractor pins were placed in the vertebral bodies of C5 and C7. The distractor was placed and the disc spaces gently distracted. The annulus at C5/6 was opened using a Bovie. Using curettes and a pituitary rongeur, the disc was removed from the endplate. Using a nerve hook, the posterior longitudinal ligament was elevated and divided. Using curettes and a 78mm Kerrison punch, the posterior longitudinal ligament was removed and the spinal cord and neuroforamina decompressed. The nerve hook was used to confirm decompression.   Then, annulus at C6/7 was opened using a Bovie, cutting away from the esophagus. Using curettes and a pituitary rongeur, the disc was removed from the endplate. Using a nerve hook, the posterior longitudinal ligament was elevated and divided. Using curettes and a 69mm Kerrison punch, the posterior longitudinal ligament was removed and the spinal cord and neuroforamina decompressed. The nerve hook was used to confirm decompression.   After disc preparation, a portion of the C6 vertebral body was removed with a Leksell rongeur and saved for used as autograft. The high speed drill was then used to finish the corpectomy. At least 70% of the vertebral body was removed in total. The site was measured to confirm adequate width. The posterior longitudinal ligament was then elevated from the inferior disc space and divided superiorly until the vertebral body wall was removed.  The nerve hook was then used to confirm decompression of the spinal cord. The endplates  were gently decorticated for arthrodesis preparation.  After decompression,  the corpectomy defect was measured and a size 20-25 mm Nuvasive Xcore mini vertebral body replacement was chosen. It was filled with autograft, and then placed used flouroscopic guidance. After this was placed, a size 40 mm Nuvasive Archon plate was sized using flouroscopy and then placed. Two screws placed in the vertebral bodies at C5 and C7,  making sure the screws were locked.  Final radiographs were taken to confirm placement.  With everything in good position, the wound was irrigated copiously with bacitracin-containing solution and meticulous hemostasis obtained.  Wound was closed in 2 layers using interrupted inverted 3-0 Vicryl sutures.  The wound was dressed with dermabond, the head of bed at 30 degrees, taken to recovery room in stable condition.  No immediate complications were noted.  Electrophysiologic monitoring was stable throughout.  I performed the entire procedure with Marin Olp PA as an Environmental consultant.  Meade Maw MD

## 2017-10-16 NOTE — Anesthesia Procedure Notes (Signed)
Procedure Name: Intubation Date/Time: 10/16/2017 9:48 AM Performed by: Eben Burow, CRNA Pre-anesthesia Checklist: Patient identified, Emergency Drugs available, Suction available, Patient being monitored and Timeout performed Patient Re-evaluated:Patient Re-evaluated prior to induction Oxygen Delivery Method: Circle system utilized Preoxygenation: Pre-oxygenation with 100% oxygen Induction Type: IV induction Ventilation: Mask ventilation without difficulty Laryngoscope Size: McGraph and 3 Grade View: Grade I Tube type: Oral Tube size: 7.0 mm Number of attempts: 1 Airway Equipment and Method: Stylet,  LTA kit utilized and Video-laryngoscopy Placement Confirmation: ETT inserted through vocal cords under direct vision,  positive ETCO2 and breath sounds checked- equal and bilateral Secured at: 21 cm Tube secured with: Tape Dental Injury: Teeth and Oropharynx as per pre-operative assessment

## 2017-10-16 NOTE — Progress Notes (Signed)
Dr Kayleen Memos aware of potassium results, 3.2

## 2017-10-17 ENCOUNTER — Encounter: Payer: Self-pay | Admitting: Neurosurgery

## 2017-10-17 DIAGNOSIS — M4802 Spinal stenosis, cervical region: Secondary | ICD-10-CM | POA: Diagnosis not present

## 2017-10-17 LAB — GLUCOSE, CAPILLARY: GLUCOSE-CAPILLARY: 118 mg/dL — AB (ref 65–99)

## 2017-10-17 MED ORDER — OXYCODONE HCL 5 MG PO TABS: 5.0000 mg | ORAL_TABLET | ORAL | 0 refills | Status: DC | PRN

## 2017-10-17 MED ORDER — METHOCARBAMOL 500 MG PO TABS: 500.0000 mg | ORAL_TABLET | Freq: Four times a day (QID) | ORAL | 0 refills | Status: DC | PRN

## 2017-10-17 NOTE — Discharge Instructions (Signed)

## 2017-10-17 NOTE — Anesthesia Postprocedure Evaluation (Signed)
Anesthesia Post Note  Patient: Christina Decker  Procedure(s) Performed: ANTERIOR CERVICAL DECOMPRESSION/DISCECTOMY FUSION 2 LEVELS-C5-7, C6 CORPECTOMY (N/A Neck)  Patient location during evaluation: PACU Anesthesia Type: General Level of consciousness: awake and alert and oriented Pain management: pain level controlled Vital Signs Assessment: post-procedure vital signs reviewed and stable Respiratory status: spontaneous breathing Cardiovascular status: blood pressure returned to baseline Anesthetic complications: no     Last Vitals:  Vitals:   10/17/17 0748 10/17/17 1106  BP: (!) 149/87 135/78  Pulse: 81 93  Resp:  18  Temp: 36.4 C 36.9 C  SpO2: 100% 99%    Last Pain:  Vitals:   10/17/17 1106  TempSrc: Oral  PainSc:                  [name]

## 2017-10-17 NOTE — Progress Notes (Signed)
IV removed. VSS. Pt in no distress. Discharge instructions explained to pt and pt verbalized understanding. NT wheeled pt to visitors entrance and assisted into family members vehicle.

## 2017-10-17 NOTE — Plan of Care (Signed)
  Problem: Education: Goal: Knowledge of General Education information will improve Outcome: Progressing   Problem: Health Behavior/Discharge Planning: Goal: Ability to manage health-related needs will improve Outcome: Progressing   Problem: Clinical Measurements: Goal: Ability to maintain clinical measurements within normal limits will improve Outcome: Progressing Goal: Will remain free from infection Outcome: Progressing Goal: Diagnostic test results will improve Outcome: Progressing Goal: Respiratory complications will improve Outcome: Progressing Goal: Cardiovascular complication will be avoided Outcome: Progressing   Problem: Activity: Goal: Risk for activity intolerance will decrease Outcome: Progressing   Problem: Nutrition: Goal: Adequate nutrition will be maintained Outcome: Progressing   Problem: Coping: Goal: Level of anxiety will decrease Outcome: Progressing   Problem: Elimination: Goal: Will not experience complications related to bowel motility Outcome: Progressing Goal: Will not experience complications related to urinary retention Outcome: Progressing   

## 2017-10-17 NOTE — Care Management Obs Status (Signed)
MEDICARE OBSERVATION STATUS NOTIFICATION   Patient Details  Name: Christina Decker MRN: [id number] Date of Birth: 07-02-1970   Medicare Observation Status Notification Given:  Yes    Jolly Mango, RN 10/17/2017, 8:55 AM

## 2017-10-17 NOTE — Discharge Summary (Signed)
  History: Christina Decker is POD1 s/p C5-7 ACDF with C6 corpectomy. Pain prior to surgery has significantly improved. Complains of mild achiness in right arm and weakness. Also complains of posterior cervical soreness and weakness.  Patient has eaten without much difficulty with swallowing. She does feel a "catching" sensation but resolves. Able to tolerate liquids without issue. Patient has ambulated and voided without issue. Overall pain rated 3/10.  She felt a little weak and dizzy last night but that has resolved.   Physical Exam: Vitals:   10/17/17 0431 10/17/17 0748  BP: 131/76 (!) 149/87  Pulse: 76 81  Resp: 16   Temp: 97.7 F (36.5 C) 97.6 F (36.4 C)  SpO2: 99% 100%    AA Ox3 Skin: incision site intact. Glue intact. Mild swelling  Strength:5/5 throughout except 4+/5 right wrist flexion Sensation: intact and symmetric upper and lower extremities.   Data:  Recent Labs  Lab 10/16/17 0826  NA 140  K 3.2*  GLUCOSE 130*   No results for input(s): AST, ALT, ALKPHOS in the last 168 hours.  Invalid input(s): TBILI   Recent Labs  Lab 10/16/17 0826  HGB 13.3  HCT 39.0   No results for input(s): APTT, INR in the last 168 hours.       Other tests/results:  EXAM: CERVICAL SPINE - 2-3 VIEW  COMPARISON:  09/08/2008  FINDINGS: Surgical changes of anterior cervical C6 corpectomy with anterior plate and screw fixation spanning C5-C7. The corpectomy body spacer is centered/aligned.  No hardware fracture.  No acute fracture line identified.  Open mouth odontoid view unremarkable  IMPRESSION: Interval changes of anterior cervical C6 corpectomy with anterior plate screw fixation spanning C5-C7, with no immediate complicating features.   Assessment/Plan:  Christina Decker is POD1 s/p C5-7 ACDF with C6 corpectomy. Patient is recovering very well with minimal complaints. Discussed soft food diet with plenty of liquid until solid food is more tolerable. Advised to  keep head elevated to help with swelling. Patient inquired about soft collar she has at home - advised she may use it intermittently for support but not so frequently to cause dependence or if it irritates incision site. Reviewed post op physical restrictions and wound care.  Pain will continue to be controlled with tylenol, oxycodone, and robaxin as needed.  All questions and concerns were addressed. Patient advised to contact office if additional questions or concerns arise.  2 week follow up in clinic already scheduled.   Marin Olp PA-C Department of Neurosurgery

## 2017-10-18 ENCOUNTER — Telehealth: Payer: Self-pay

## 2017-10-18 NOTE — Care Management (Signed)
Late note entry 10/18/17- patient requesting another muscle relaxer- said Robaxin is not covered under insurance.  Ass't called patient back and instructed her to reach out to Dr. Doristine Counter office.  This RNCM has notified Dr. Diamantina Providence of this request.

## 2017-10-18 NOTE — Telephone Encounter (Signed)
NOTE:  Patient had surgery on 10/16/17 and called in as insurance company will not cover the muscle relaxer MD prescribed. Pharmacy directed her to call RN CM.  RN CM to text MD and let him know of the situation and patient to call MD office to follow-up for alternate medication.

## 2017-10-20 ENCOUNTER — Emergency Department
Admission: EM | Admit: 2017-10-20 | Discharge: 2017-10-21 | Disposition: A | Discharge status: Home / Self Care | Payer: Medicare Other | Attending: Emergency Medicine | Admitting: Emergency Medicine

## 2017-10-20 ENCOUNTER — Emergency Department: Payer: Medicare Other

## 2017-10-20 ENCOUNTER — Other Ambulatory Visit: Payer: Self-pay

## 2017-10-20 ENCOUNTER — Encounter: Payer: Self-pay | Admitting: Radiology

## 2017-10-20 DIAGNOSIS — E119 Type 2 diabetes mellitus without complications: Secondary | ICD-10-CM | POA: Insufficient documentation

## 2017-10-20 DIAGNOSIS — E876 Hypokalemia: Secondary | ICD-10-CM | POA: Diagnosis not present

## 2017-10-20 DIAGNOSIS — I10 Essential (primary) hypertension: Secondary | ICD-10-CM | POA: Diagnosis not present

## 2017-10-20 DIAGNOSIS — R42 Dizziness and giddiness: Secondary | ICD-10-CM | POA: Diagnosis not present

## 2017-10-20 DIAGNOSIS — D649 Anemia, unspecified: Secondary | ICD-10-CM | POA: Insufficient documentation

## 2017-10-20 LAB — TROPONIN I: Troponin I: <0.03 ng/mL (ref <0.03)

## 2017-10-20 LAB — BASIC METABOLIC PANEL
Anion gap: 10 (ref 5–15)
BUN: 8 mg/dL (ref 6–20)
CHLORIDE: 95 mmol/L — AB (ref 101–111)
CO2: 32 mmol/L (ref 22–32)
Calcium: 8.9 mg/dL (ref 8.9–10.3)
Creatinine, Ser: 0.99 mg/dL (ref 0.44–1.00)
GFR calc Af Amer: >60 mL/min (ref >60)
GFR calc non Af Amer: >60 mL/min (ref >60)
GLUCOSE: 177 mg/dL — AB (ref 65–99)
POTASSIUM: 2.7 mmol/L — AB (ref 3.5–5.1)
Sodium: 137 mmol/L (ref 135–145)

## 2017-10-20 LAB — CBC
HEMATOCRIT: 29.4 % — AB (ref 35.0–47.0)
Hemoglobin: 9.9 g/dL — ABNORMAL LOW (ref 12.0–16.0)
MCH: 29.8 pg (ref 26.0–34.0)
MCHC: 33.7 g/dL (ref 32.0–36.0)
MCV: 88.4 fL (ref 80.0–100.0)
Platelets: 449 10*3/uL — ABNORMAL HIGH (ref 150–440)
RBC: 3.33 MIL/uL — ABNORMAL LOW (ref 3.80–5.20)
RDW: 13.7 % (ref 11.5–14.5)
WBC: 14.5 10*3/uL — ABNORMAL HIGH (ref 3.6–11.0)

## 2017-10-20 MED ORDER — POTASSIUM CHLORIDE ER 10 MEQ PO TBCR: 10.0000 meq | EXTENDED_RELEASE_TABLET | Freq: Two times a day (BID) | ORAL | 0 refills | Status: DC

## 2017-10-20 MED ORDER — SODIUM CHLORIDE 0.9 % IV SOLN
Freq: Once | INTRAVENOUS | Status: AC
  Administered 2017-10-20: 22:00:00 via INTRAVENOUS

## 2017-10-20 MED ORDER — POTASSIUM CHLORIDE CRYS ER 20 MEQ PO TBCR: 60.0000 meq | EXTENDED_RELEASE_TABLET | Freq: Once | ORAL | Status: DC

## 2017-10-20 MED ORDER — MECLIZINE HCL 25 MG PO TABS
50.0000 mg | ORAL_TABLET | Freq: Once | ORAL | Status: AC
  Administered 2017-10-20: 50 mg via ORAL
  Filled 2017-10-20: qty 2

## 2017-10-20 MED ORDER — POTASSIUM CHLORIDE 20 MEQ PO PACK
60.0000 meq | PACK | Freq: Two times a day (BID) | ORAL | Status: DC
  Administered 2017-10-20: 60 meq via ORAL
  Filled 2017-10-20: qty 3

## 2017-10-20 MED ORDER — IOPAMIDOL (ISOVUE-370) INJECTION 76%
75.0000 mL | Freq: Once | INTRAVENOUS | Status: AC | PRN
  Administered 2017-10-20: 75 mL via INTRAVENOUS

## 2017-10-20 NOTE — ED Provider Notes (Signed)
[facility] Emergency Department Provider Note       Time seen: ----------------------------------------- 9:54 PM on 10/20/2017 -----------------------------------------   I have reviewed the triage vital signs and the nursing notes.  HISTORY   Chief Complaint Dizziness    HPI Christina Decker is a 47 y.o. female with a history of anxiety, balance disturbance, depression, diabetes, fibromuscular hyperplasia, fibromyalgia, IBS, syncope who presents to the ED for dizziness.  Patient reports feeling dizzy since yesterday evening.  She reports she had cervical surgery 4 days ago and she states her pain is under control.  She is not sure if she is eating and drinking enough.  She describes both lightheadedness and room spinning sensation.  She denies fevers, chills, chest pain, shortness of breath, vomiting or diarrhea.  Past Medical History:  Diagnosis Date  . Abnormal heart rhythms   . Abnormal thyroid stimulating hormone level   . Anxiety   . Balance problem   . Cervical disc disease   . Depression   . Diabetes mellitus without complication ([place])   . Dyslipidemia   . Dyspareunia due to medical condition in female   . Dysrhythmia   . Environmental and seasonal allergies   . Fatty liver disease, nonalcoholic   . Fibrocystic breast disease   . Fibromuscular hyperplasia ([place])   . Fibromuscular hyperplasia ([place])   . Fibromyalgia   . Fibromyalgia   . Hemorrhoid   . Hx of adenomatous colonic polyps   . Hyperkalemia   . Hypertension   . IBS (irritable bowel syndrome)   . IBS (irritable bowel syndrome)   . Low back pain   . Lower extremity edema   . Migraine   . Narcolepsy   . Narcolepsy and cataplexy   . Pain in left arm   . Pain in left shoulder   . Syncope   . Thyroid disease   . Vitamin D deficiency     Patient Active Problem List   Diagnosis Date Noted  . Cervical myelopathy ([place]) 10/16/2017  . Essential hypertension 07/19/2016  .  Fibromyalgia 07/19/2016  . Fibromuscular dysplasia ([place]) 07/19/2016  . DJD (degenerative joint disease) 07/19/2016    Past Surgical History:  Procedure Laterality Date  . ABDOMINAL HYSTERECTOMY    . ANTERIOR CERVICAL DECOMP/DISCECTOMY FUSION N/A 10/16/2017   Procedure: ANTERIOR CERVICAL DECOMPRESSION/DISCECTOMY FUSION 2 LEVELS-C5-7, C6 CORPECTOMY;  Surgeon: Meade Maw, MD;  Location: [facility] ORS;  Service: Neurosurgery;  Laterality: N/A;  . HEMORRHOID SURGERY    . HERNIA REPAIR      Allergies Patient has no known allergies.  Social History Social History   Tobacco Use  . Smoking status: Never Smoker  . Smokeless tobacco: Never Used  Substance Use Topics  . Alcohol use: Yes    Alcohol/week: 1.2 oz    Types: 2 Glasses of wine per week    Comment: occasional  . Drug use: No    Review of Systems Constitutional: Negative for fever. Cardiovascular: Negative for chest pain. Respiratory: Negative for shortness of breath. Gastrointestinal: Negative for abdominal pain, vomiting and diarrhea. Genitourinary: Negative for dysuria. Musculoskeletal: Negative for back pain. Skin: Positive for left-sided anterior neck surgery incision Neurological: Negative for headaches, focal weakness or numbness.  Positive for dizziness  All systems negative/normal/unremarkable except as stated in the HPI  ____________________________________________   PHYSICAL EXAM:  VITAL SIGNS: ED Triage Vitals  Enc Vitals Group     BP 10/20/17 2143 100/69     Pulse Rate 10/20/17 2143 99  Resp 10/20/17 2143 18     Temp 10/20/17 2143 99.5 F (37.5 C)     Temp Source 10/20/17 2143 Oral     SpO2 10/20/17 2143 97 %     Weight --      Height --      Head Circumference --      Peak Flow --      Pain Score 10/20/17 2144 5     Pain Loc --      Pain Edu? --      Excl. in [place]? --    Constitutional: Alert and oriented. Well appearing and in no distress. Eyes: Conjunctivae are normal. Normal  extraocular movements. ENT   Head: Normocephalic and atraumatic.   Nose: No congestion/rhinnorhea.   Mouth/Throat: Mucous membranes are moist.   Neck: No stridor. Cardiovascular: Normal rate, regular rhythm. No murmurs, rubs, or gallops. Respiratory: Normal respiratory effort without tachypnea nor retractions. Breath sounds are clear and equal bilaterally. No wheezes/rales/rhonchi. Gastrointestinal: Soft and nontender. Normal bowel sounds. Rectal: No gross blood, heme-negative stool Musculoskeletal: Nontender with normal range of motion in extremities. No lower extremity tenderness nor edema. Neurologic:  Normal speech and language. No gross focal neurologic deficits are appreciated.  Skin:  Skin is warm, dry and intact. No rash noted. Psychiatric: Mood and affect are normal. Speech and behavior are normal.  ____________________________________________  EKG: Interpreted by me.  Sinus rhythm the rate of 98 bpm, LVH, normal QT, normal axis  ____________________________________________  ED COURSE:  As part of my medical decision making, I reviewed the following data within the [place] History obtained from family if available, nursing notes, old chart and ekg, as well as notes from prior ED visits. Patient presented for dizziness, we will assess with labs and imaging as indicated at this time.   Procedures ____________________________________________   LABS (pertinent positives/negatives)  Labs Reviewed  BASIC METABOLIC PANEL - Abnormal; Notable for the following components:      Result Value   Potassium 2.7 (*)    Chloride 95 (*)    Glucose, Bld 177 (*)    All other components within normal limits  CBC - Abnormal; Notable for the following components:   WBC 14.5 (*)    RBC 3.33 (*)    Hemoglobin 9.9 (*)    HCT 29.4 (*)    Platelets 449 (*)    All other components within normal limits  TROPONIN I  URINALYSIS, COMPLETE (UACMP) WITH MICROSCOPIC    ____________________________________________  DIFFERENTIAL DIAGNOSIS   Dehydration, electrolyte abnormality, vertigo, arrhythmia, occult infection  FINAL ASSESSMENT AND PLAN  Dizziness, anemia, hypokalemia   Plan: The patient had presented for dizziness since last night. Patient's labs did indicate anemia likely from blood loss.  According to her surgery notes she lost 1 L of blood during the anterior cervical fusion. Patient's imaging is still pending at this time.   Laurence Aly, MD   Note: This note was generated in part or whole with voice recognition software. Voice recognition is usually quite accurate but there are transcription errors that can and very often do occur. I apologize for any typographical errors that were not detected and corrected.     Earleen Newport, MD 10/20/17 [phone]

## 2017-10-20 NOTE — ED Triage Notes (Signed)
Reports feeling dizzy since yesterday evening.  Reports had cervical surgery on 10/16/17.

## 2017-10-20 NOTE — Discharge Instructions (Addendum)
Take potassium tablets as prescribed.  Drink plenty of fluids daily.  Return to the ER for worsening symptoms, persistent vomiting, difficulty breathing or other concerns.

## 2017-10-20 NOTE — ED Notes (Signed)
Pt to ct 

## 2017-10-21 LAB — URINALYSIS, COMPLETE (UACMP) WITH MICROSCOPIC
Bilirubin Urine: NEGATIVE
Glucose, UA: NEGATIVE mg/dL
Hgb urine dipstick: NEGATIVE
KETONES UR: NEGATIVE mg/dL
Leukocytes, UA: NEGATIVE
Nitrite: NEGATIVE
PROTEIN: NEGATIVE mg/dL
Specific Gravity, Urine: >1.046 — ABNORMAL HIGH (ref 1.005–1.030)
pH: 6 (ref 5.0–8.0)

## 2017-10-21 NOTE — ED Provider Notes (Signed)
-----------------------------------------   12:54 AM on 10/21/2017 -----------------------------------------  ED interpretation of CT neck: No abscess or hematoma  CT soft tissue neck interpreted per Dr. Toney Reil: C5-C7 anterior cervical fusion and C6 corpectomy. Postsurgical  changes within the left anterior neck and extensive prevertebral  edema from C1-T1. No rim enhancement to suggest abscess or  inflammation. No hematoma.   Patient resting in no acute distress.  Overall feeling better.  Updated her of CT and urinalysis results.  Will discharge home per previous providers plan with prescription for potassium tablets.  Strict return precautions given.  Patient verbalizes understanding and agrees with plan of care.   Paulette Blanch, MD 10/21/17 [phone]

## 2018-11-19 ENCOUNTER — Emergency Department (HOSPITAL_COMMUNITY): Payer: Medicare Other

## 2018-11-19 ENCOUNTER — Encounter (HOSPITAL_COMMUNITY): Payer: Self-pay | Admitting: Emergency Medicine

## 2018-11-19 ENCOUNTER — Other Ambulatory Visit: Payer: Self-pay

## 2018-11-19 ENCOUNTER — Emergency Department (HOSPITAL_COMMUNITY)
Admission: EM | Admit: 2018-11-19 | Discharge: 2018-11-19 | Disposition: A | Discharge status: Home / Self Care | Payer: Medicare Other | Attending: Emergency Medicine | Admitting: Emergency Medicine

## 2018-11-19 DIAGNOSIS — E119 Type 2 diabetes mellitus without complications: Secondary | ICD-10-CM | POA: Insufficient documentation

## 2018-11-19 DIAGNOSIS — Z7984 Long term (current) use of oral hypoglycemic drugs: Secondary | ICD-10-CM | POA: Diagnosis not present

## 2018-11-19 DIAGNOSIS — R079 Chest pain, unspecified: Secondary | ICD-10-CM

## 2018-11-19 DIAGNOSIS — R52 Pain, unspecified: Secondary | ICD-10-CM

## 2018-11-19 DIAGNOSIS — R0789 Other chest pain: Secondary | ICD-10-CM | POA: Diagnosis not present

## 2018-11-19 DIAGNOSIS — M791 Myalgia, unspecified site: Secondary | ICD-10-CM | POA: Insufficient documentation

## 2018-11-19 DIAGNOSIS — R531 Weakness: Secondary | ICD-10-CM | POA: Insufficient documentation

## 2018-11-19 DIAGNOSIS — Z7982 Long term (current) use of aspirin: Secondary | ICD-10-CM | POA: Diagnosis not present

## 2018-11-19 DIAGNOSIS — I10 Essential (primary) hypertension: Secondary | ICD-10-CM | POA: Insufficient documentation

## 2018-11-19 LAB — CBC WITH DIFFERENTIAL/PLATELET
Abs Immature Granulocytes: 0.02 10*3/uL (ref 0.00–0.07)
Basophils Absolute: 0 10*3/uL (ref 0.0–0.1)
Basophils Relative: 0 %
Eosinophils Absolute: 0.1 10*3/uL (ref 0.0–0.5)
Eosinophils Relative: 1 %
HCT: 40.8 % (ref 36.0–46.0)
Hemoglobin: 13.2 g/dL (ref 12.0–15.0)
Immature Granulocytes: 0 %
Lymphocytes Relative: 20 %
Lymphs Abs: 2.1 10*3/uL (ref 0.7–4.0)
MCH: 28.6 pg (ref 26.0–34.0)
MCHC: 32.4 g/dL (ref 30.0–36.0)
MCV: 88.3 fL (ref 80.0–100.0)
Monocytes Absolute: 0.9 10*3/uL (ref 0.1–1.0)
Monocytes Relative: 9 %
Neutro Abs: 7.1 10*3/uL (ref 1.7–7.7)
Neutrophils Relative %: 70 %
Platelets: 432 10*3/uL — ABNORMAL HIGH (ref 150–400)
RBC: 4.62 MIL/uL (ref 3.87–5.11)
RDW: 13.7 % (ref 11.5–15.5)
WBC: 10.2 10*3/uL (ref 4.0–10.5)
nRBC: 0 % (ref 0.0–0.2)

## 2018-11-19 LAB — URINALYSIS, ROUTINE W REFLEX MICROSCOPIC
Bilirubin Urine: NEGATIVE
Glucose, UA: NEGATIVE mg/dL
Hgb urine dipstick: NEGATIVE
Ketones, ur: NEGATIVE mg/dL
Nitrite: NEGATIVE
Protein, ur: NEGATIVE mg/dL
Specific Gravity, Urine: 1.016 (ref 1.005–1.030)
pH: 6 (ref 5.0–8.0)

## 2018-11-19 LAB — COMPREHENSIVE METABOLIC PANEL
ALT: 17 U/L (ref 0–44)
AST: 17 U/L (ref 15–41)
Albumin: 3.9 g/dL (ref 3.5–5.0)
Alkaline Phosphatase: 101 U/L (ref 38–126)
Anion gap: 10 (ref 5–15)
BUN: 8 mg/dL (ref 6–20)
CO2: 28 mmol/L (ref 22–32)
Calcium: 9.3 mg/dL (ref 8.9–10.3)
Chloride: 101 mmol/L (ref 98–111)
Creatinine, Ser: 0.84 mg/dL (ref 0.44–1.00)
GFR calc Af Amer: >60 mL/min (ref >60)
GFR calc non Af Amer: >60 mL/min (ref >60)
Glucose, Bld: 105 mg/dL — ABNORMAL HIGH (ref 70–99)
Potassium: 3.2 mmol/L — ABNORMAL LOW (ref 3.5–5.1)
Sodium: 139 mmol/L (ref 135–145)
Total Bilirubin: 0.6 mg/dL (ref 0.3–1.2)
Total Protein: 7.8 g/dL (ref 6.5–8.1)

## 2018-11-19 LAB — TROPONIN I
Troponin I: <0.03 ng/mL (ref <0.03)
Troponin I: <0.03 ng/mL (ref <0.03)

## 2018-11-19 LAB — CBG MONITORING, ED: Glucose-Capillary: 88 mg/dL (ref 70–99)

## 2018-11-19 NOTE — ED Notes (Signed)
Pt c/ogeneralized body aches/malaise/nausea and heartburn x 3 weeks. Pt states she feels dehydrated.

## 2018-11-19 NOTE — ED Provider Notes (Signed)
[facility] EMERGENCY DEPARTMENT Provider Note   CSN: [id number] Arrival date & time: 11/19/18  1230    History   Chief Complaint Chief Complaint  Patient presents with  . Generalized Body Aches  . Weakness    HPI Christina Decker is a 48 y.o. female with a PMH of Diabetes, Dyslipidemia, Anxiety, Migraines, HTN, and Fibromyalgia presenting with constant generalized body aches and generalized weakness onset 6 days ago. Patient also reports intermittent central non radiating chest pain onset 1 week ago. Patient denies chest pain currently, but states last episode was this morning. Patient describes pain as a pressure. Patient states nothing makes symptoms better or worse. Patient reports an intermittent dry cough for 2-3 weeks. Patient states she has tried OTC heart burn medication with relief. Patient reports intermittent frontal headaches, but denies a headache currently. Patient denies numbness, paresthesias, vision changes, syncope, speech difficulty, or dizziness. Patient denies fever, shortness of breath, congestion, rhinorrhea, nausea, vomiting, abdominal pain, diarrhea, dysuria, frequency, or flank pain. Patient denies leg pain, leg edema, recent travel, recent surgery, or hx of DVT/PE. Patient reports anxiety and stress at times. Patient denies tobacco or drug use. Patient reports occasional alcohol use. Patient reports compliance with her medications.      HPI  Past Medical History:  Diagnosis Date  . Abnormal heart rhythms   . Abnormal thyroid stimulating hormone level   . Anxiety   . Balance problem   . Cervical disc disease   . Depression   . Diabetes mellitus without complication ([place])   . Dyslipidemia   . Dyspareunia due to medical condition in female   . Dysrhythmia   . Environmental and seasonal allergies   . Fatty liver disease, nonalcoholic   . Fibrocystic breast disease   . Fibromuscular hyperplasia ([place])   . Fibromuscular hyperplasia ([place])   . Fibromyalgia   .  Fibromyalgia   . Hemorrhoid   . Hx of adenomatous colonic polyps   . Hyperkalemia   . Hypertension   . IBS (irritable bowel syndrome)   . IBS (irritable bowel syndrome)   . Low back pain   . Lower extremity edema   . Migraine   . Narcolepsy   . Narcolepsy and cataplexy   . Pain in left arm   . Pain in left shoulder   . Syncope   . Thyroid disease   . Vitamin D deficiency     Patient Active Problem List   Diagnosis Date Noted  . Cervical myelopathy ([place]) 10/16/2017  . Essential hypertension 07/19/2016  . Fibromyalgia 07/19/2016  . Fibromuscular dysplasia ([place]) 07/19/2016  . DJD (degenerative joint disease) 07/19/2016    Past Surgical History:  Procedure Laterality Date  . ABDOMINAL HYSTERECTOMY    . ANTERIOR CERVICAL DECOMP/DISCECTOMY FUSION N/A 10/16/2017   Procedure: ANTERIOR CERVICAL DECOMPRESSION/DISCECTOMY FUSION 2 LEVELS-C5-7, C6 CORPECTOMY;  Surgeon: Meade Maw, MD;  Location: [facility] ORS;  Service: Neurosurgery;  Laterality: N/A;  . HEMORRHOID SURGERY    . HERNIA REPAIR       OB History   No obstetric history on file.      Home Medications    Prior to Admission medications   Medication Sig Start Date End Date Taking? Authorizing Provider  acetaminophen (TYLENOL) 500 MG tablet Take 1,000 mg by mouth every 6 (six) hours as needed for moderate pain.   Yes [provider]  amLODipine (NORVASC) 5 MG tablet Take 5 mg by mouth daily. 01/12/14  Yes [provider]  amphetamine-dextroamphetamine (ADDERALL  XR) 15 MG 24 hr capsule Take 15 mg by mouth daily.   Yes [provider]  aspirin EC 81 MG tablet Take 1 tablet by mouth daily.   Yes [provider]  atorvastatin (LIPITOR) 40 MG tablet Take 40 mg by mouth every evening.   Yes [provider]  diazepam (VALIUM) 10 MG tablet Take 10 mg by mouth every 6 (six) hours as needed for anxiety.  09/25/13  Yes [provider]  DULoxetine (CYMBALTA) 60 MG capsule Take 60  mg by mouth daily. *May take an additional dose if needed for anxiety* 12/21/13  Yes [provider]  hydrochlorothiazide (HYDRODIURIL) 12.5 MG tablet Take 12.5 mg by mouth daily. 01/17/14  Yes [provider]  lisinopril (PRINIVIL,ZESTRIL) 40 MG tablet Take 40 mg by mouth daily. 01/17/14  Yes [provider]  metFORMIN (GLUCOPHAGE-XR) 500 MG 24 hr tablet Take 500 mg by mouth 2 (two) times daily.    Yes [provider]  methocarbamol (ROBAXIN) 500 MG tablet Take 1 tablet (500 mg total) by mouth every 6 (six) hours as needed for muscle spasms. 10/17/17  Yes Marin Olp, PA-C  metoprolol (LOPRESSOR) 100 MG tablet Take 100 mg by mouth 2 (two) times daily. 12/21/13  Yes [provider]  omeprazole (PRILOSEC) 20 MG capsule Take 20 mg by mouth daily as needed.   Yes [provider]  oxybutynin (DITROPAN XL) 15 MG 24 hr tablet Take 15 mg by mouth at bedtime.    [provider]    Family History No family history on file.  Social History Social History   Tobacco Use  . Smoking status: Never Smoker  . Smokeless tobacco: Never Used  Substance Use Topics  . Alcohol use: Yes    Alcohol/week: 2.0 standard drinks    Types: 2 Glasses of wine per week    Comment: occasional  . Drug use: No     Allergies   Amoxicillin-pot clavulanate   Review of Systems Review of Systems  Constitutional: Negative for chills, diaphoresis and fever.  HENT: Negative for congestion and rhinorrhea.   Eyes: Negative for visual disturbance.  Respiratory: Positive for cough. Negative for shortness of breath.   Cardiovascular: Positive for chest pain. Negative for palpitations and leg swelling.  Gastrointestinal: Negative for abdominal pain, nausea and vomiting.  Endocrine: Negative for cold intolerance and heat intolerance.  Genitourinary: Negative for dysuria, flank pain, frequency and hematuria.  Musculoskeletal: Positive for myalgias. Negative for  arthralgias, back pain, gait problem, neck pain and neck stiffness.  Skin: Negative for rash.  Neurological: Positive for weakness and headaches. Negative for dizziness, tremors, seizures, syncope, facial asymmetry, speech difficulty, light-headedness and numbness.  Hematological: Negative for adenopathy.     Physical Exam Updated Vital Signs BP (!) 145/103 (BP Location: Right Arm)   Pulse 96   Temp 98.7 F (37.1 C) (Oral)   Resp 18   Ht 5\' 5"  (1.651 m)   Wt 93.4 kg   SpO2 100%   BMI 34.28 kg/m   Physical Exam Vitals signs and nursing note reviewed.  Constitutional:      General: She is not in acute distress.    Appearance: She is well-developed. She is not diaphoretic.  HENT:     Head: Normocephalic and atraumatic.     Right Ear: Tympanic membrane, ear canal and external ear normal.     Left Ear: Tympanic membrane, ear canal and external ear normal.     Nose: Nose normal. No  rhinorrhea.     Mouth/Throat:     Mouth: Mucous membranes are moist.     Pharynx: No posterior oropharyngeal erythema.  Eyes:     Extraocular Movements: Extraocular movements intact.     Conjunctiva/sclera: Conjunctivae normal.     Pupils: Pupils are equal, round, and reactive to light.  Neck:     Musculoskeletal: Normal range of motion.  Cardiovascular:     Rate and Rhythm: Normal rate and regular rhythm.     Heart sounds: Normal heart sounds. No murmur. No friction rub. No gallop.   Pulmonary:     Effort: Pulmonary effort is normal. No respiratory distress.     Breath sounds: Normal breath sounds. No wheezing or rales.  Abdominal:     Palpations: Abdomen is soft.     Tenderness: There is no abdominal tenderness.  Musculoskeletal: Normal range of motion.        General: No tenderness.     Right lower leg: No edema.     Left lower leg: No edema.  Skin:    General: Skin is warm.     Findings: No erythema or rash.  Neurological:     Mental Status: She is alert.    Mental Status:  Alert,  oriented, thought content appropriate, able to give a coherent history. Speech fluent without evidence of aphasia. Able to follow 2 step commands without difficulty.  Cranial Nerves:  II:  Peripheral visual fields grossly normal, pupils equal, round, reactive to light III,IV, VI: ptosis not present, extra-ocular motions intact bilaterally  V,VII: smile symmetric, facial light touch sensation equal VIII: hearing grossly normal to voice  IX,X: symmetric elevation of soft palate, uvula elevates symmetrically  XI: bilateral shoulder shrug symmetric and strong XII: midline tongue extension without fassiculations Motor:  Normal tone. 5/5 in upper and lower extremities bilaterally including strong and equal grip strength and dorsiflexion/plantar flexion Sensory: Pinprick and light touch normal in all extremities.  Deep Tendon Reflexes: 2+ and symmetric in the biceps and patella Cerebellar: normal finger-to-nose with bilateral upper extremities Gait: normal gait and balance.  Negative pronator drift. Negative Romberg sign. CV: distal pulses palpable throughout    ED Treatments / Results  Labs (all labs ordered are listed, but only abnormal results are displayed) Labs Reviewed  COMPREHENSIVE METABOLIC PANEL - Abnormal; Notable for the following components:      Result Value   Potassium 3.2 (*)    Glucose, Bld 105 (*)    All other components within normal limits  CBC WITH DIFFERENTIAL/PLATELET - Abnormal; Notable for the following components:   Platelets 432 (*)    All other components within normal limits  URINALYSIS, ROUTINE W REFLEX MICROSCOPIC - Abnormal; Notable for the following components:   APPearance HAZY (*)    Leukocytes,Ua SMALL (*)    Bacteria, UA RARE (*)    All other components within normal limits  URINE CULTURE  TROPONIN I  TROPONIN I  CBG MONITORING, ED  CBG MONITORING, ED    EKG EKG Interpretation  Date/Time:  Wednesday November 19 2018 16:39:32 EDT Ventricular  Rate:  83 PR Interval:    QRS Duration: 97 QT Interval:  379 QTC Calculation: 446 R Axis:   36 Text Interpretation:  Sinus rhythm Nonspecific T abnormalities, anterior leads No STEMI  Confirmed by Nanda Quinton [phone]) on 11/19/2018 5:06:31 PM   Radiology Dg Chest Port 1 View  Result Date: 11/19/2018 CLINICAL DATA:  Loss of appetite and generalized body aches with weakness for 6  days. EXAM: PORTABLE CHEST 1 VIEW COMPARISON:  01/18/2014 FINDINGS: The cardiac silhouette, mediastinal and hilar contours are within normal limits and stable. The lungs are clear. No pleural effusions. No worrisome pulmonary lesions. The bony thorax is intact. Cervical fusion hardware is noted. IMPRESSION: No acute cardiopulmonary findings. Electronically Signed   By: Marijo Sanes M.D.   On: 11/19/2018 14:36    Procedures Procedures (including critical care time)  Medications Ordered in ED Medications - No data to display   Initial Impression / Assessment and Plan / ED Course  I have reviewed the triage vital signs and the nursing notes.  Pertinent labs & imaging results that were available during my care of the patient were reviewed by me and considered in my medical decision making (see chart for details).  Clinical Course as of Nov 18 1899  Wed Nov 19, 2018  1415 WBCs are within normal limits.  WBC: 10.2 [AH]  1440 No acute cardiopulmonary findings noted on CXR.  DG Chest Port 1 View [AH]  [phone] UA reveals small leukocytes and rare bacteria. Will send for a urine culture.  [place] GuestMarland Kitchen): SMALL [AH]    Clinical Course User Index [AH] Arville Lime, PA-C      Patient presents with generalized body aches and weakness for 6 days. Vitals, labs, and imaging reviewed. CXR is negative for cardiopulmonary disease. WBCs are within normal limits. Patient is afebrile. Neurological exam is normal. Patient is ambulating without difficulty. Multiple reassessments during the visit and patient is sitting on bed  scrolling through phone in no acute distress. Patient also reports intermittent chest pain. Patient has been asymptomatic while in the ER.  Chest pain is not likely of cardiac or pulmonary etiology d/t presentation, PERC negative, VSS, no tracheal deviation, no JVD or new murmur, RRR, breath sounds equal bilaterally, EKG without acute abnormalities, negative troponin x2, and negative CXR. Patient is to be discharged with recommendation to follow up with PCP in regards to today's hospital visit. Discussed elevated BP during today's visit. Patient denies any symptoms of hypertensive crisis while in the ER. Advised patient to follow up with PCP. Pt has been advised to return to the ED if CP becomes exertional, associated with diaphoresis or nausea, radiates to left jaw/arm, worsens or becomes concerning in any way. Pt appears reliable for follow up and is agreeable to discharge.    Final Clinical Impressions(s) / ED Diagnoses   Final diagnoses:  Generalized body aches  Generalized weakness  Nonspecific chest pain    ED Discharge Orders    None       Arville Lime, [place] 11/19/18 1901    Margette Fast, MD 11/19/18 1911

## 2018-11-19 NOTE — ED Notes (Signed)
Pt understands we need urine sample at this time, attempted two times to obtain urine sample. Pt states that she is unable to give [place] a sample.

## 2018-11-19 NOTE — ED Triage Notes (Signed)
Pt c/o of loss of appetite, generalized body aches, headache and weakness x 6 days

## 2018-11-19 NOTE — Discharge Instructions (Addendum)
You have been seen today for body aches, generalized weakness, and chest pain. Please read and follow all provided instructions.   1. Medications: tylenol for pain, usual home medications 2. Treatment: rest, drink plenty of fluids 3. Follow Up: Please follow up with your primary doctor in 2 days for discussion of your diagnoses and further evaluation after today's visit; if you do not have a primary care doctor use the resource guide provided to find one; Please return to the ER for any new or worsening symptoms. Please obtain all of your results from medical records or have your doctors office obtain the results - share them with your doctor - you should be seen at your doctors office. Call today to arrange your follow up.   Take medications as prescribed. Please review all of the medicines and only take them if you do not have an allergy to them. Return to the emergency room for worsening condition or new concerning symptoms. Follow up with your regular doctor. If you don't have a regular doctor use one of the numbers below to establish a primary care doctor.  Please be aware that if you are taking birth control pills, taking other prescriptions, ESPECIALLY ANTIBIOTICS may make the birth control ineffective - if this is the case, either do not engage in sexual activity or use alternative methods of birth control such as condoms until you have finished the medicine and your family doctor says it is OK to restart them. If you are on a blood thinner such as COUMADIN, be aware that any other medicine that you take may cause the coumadin to either work too much, or not enough - you should have your coumadin level rechecked in next 7 days if this is the case.  ?  It is also a possibility that you have an allergic reaction to any of the medicines that you have been prescribed - Everybody reacts differently to medications and while MOST people have no trouble with most medicines, you may have a reaction such as  nausea, vomiting, rash, swelling, shortness of breath. If this is the case, please stop taking the medicine immediately and contact your physician.  ?  You should return to the ER if you develop severe or worsening symptoms.   Emergency Department Resource Guide 1) Find a Doctor and Pay Out of Pocket Although you won't have to find out who is covered by your insurance plan, it is a good idea to ask around and get recommendations. You will then need to call the office and see if the doctor you have chosen will accept you as a new patient and what types of options they offer for patients who are self-pay. Some doctors offer discounts or will set up payment plans for their patients who do not have insurance, but you will need to ask so you aren't surprised when you get to your appointment.  2) Contact Your Local Health Department Not all health departments have doctors that can see patients for sick visits, but many do, so it is worth a call to see if yours does. If you don't know where your local health department is, you can check in your phone book. The CDC also has a tool to help you locate your state's health department, and many state websites also have listings of all of their local health departments.  3) Find a [facility] If your illness is not likely to be very severe or complicated, you may want to try a walk  in clinic. These are popping up all over the country in pharmacies, drugstores, and shopping centers. They're usually staffed by nurse practitioners or physician assistants that have been trained to treat common illnesses and complaints. They're usually fairly quick and inexpensive. However, if you have serious medical issues or chronic medical problems, these are probably not your best option.  No Primary Care Doctor: Call Health Connect at  [phone] - they can help you locate a primary care doctor that  accepts your insurance, provides certain services, etc. Physician Referral  Service- [phone]  Chronic Pain Problems: Organization         Address  Phone   Notes  [facility]  [phone] Patients need to be referred by their primary care doctor.   Medication Assistance: Organization         Address  Phone   Notes  [facility] Medication [facility] [address] [place], [place] [address] [phone] --Must be a resident of [facility] -- Must have NO insurance coverage whatsoever (no Medicaid/ Medicare, etc.) -- The pt. MUST have a primary care doctor that directs their care regularly and follows them in the community   [facility]  [phone]   [employer]  [phone]    Agencies that provide inexpensive medical care: Organization         Address  Phone   Notes  [place]  [phone]   [facility]    [phone]   [facility] [place], [place] [address] [phone]   [place] [address], [place] [phone]   [facility]    [phone]   [facility]    [phone]   [place] [address], [place] Phone:  [phone], Fax:  [phone] Hours of Operation:  9 am - 6 pm, M-F.  Also accepts Medicaid/Medicare and self-pay.  [facility] for [place], [address], [place] Phone: [phone], Fax: [phone]. Hours of Operation:  8:30 am - 5:30 pm, M-F.  Also accepts Medicaid and self-pay.  [facility] [address] [place] Phone: [phone]   [place], [place], [place] [phone], Ext. [phone] Mondays & Thursdays: 7-9 AM.  First 15 patients are seen on a first come, first serve basis.    [place] Providers:  Organization         Address  Phone   Notes  [facility] [address], [place] [phone] Also accepts self-pay patients.  [facility] [address], [place]  [phone]   [place], [address], [place] [phone]   [facility] [address] [place] [phone]   Lucianne Lei [address], [place]   [phone] Only accepts [place] patients after they have their name applied to their card.   Self-Pay (no insurance) in [facility]:  Organization         Address  Phone   Notes  Sickle Cell Patients, [facility] [place] [phone]   [facility] [place] [phone]   [facility]  [place]  [address], [place] [phone]   [facility]/Dr. Osei-Bonsu  [address] [place] or [address], [place] [phone] Phone number for both [place] and [place] locations is the same.  [facility] [address], [place] [phone]   [facility] [address] [place] or [address] [phone]   [facility] [address], [place] [phone], phone; [phone], fax Sees patients 1st and 3rd Saturday of every month.  Must not qualify for public or private insurance (i.e. Medicaid, Medicare, [place] [employer] Choice, Veterans' Benefits)  Household income should be no more than 200% of the poverty level The clinic cannot treat you if you are pregnant or think you are pregnant  Sexually transmitted diseases are not treated at the clinic.

## 2018-11-21 LAB — URINE CULTURE: Culture: <10000 — AB

## 2020-09-09 ENCOUNTER — Other Ambulatory Visit (INDEPENDENT_AMBULATORY_CARE_PROVIDER_SITE_OTHER): Payer: Self-pay | Admitting: Vascular Surgery

## 2020-09-09 DIAGNOSIS — I779 Disorder of arteries and arterioles, unspecified: Secondary | ICD-10-CM

## 2020-09-09 DIAGNOSIS — I6529 Occlusion and stenosis of unspecified carotid artery: Secondary | ICD-10-CM | POA: Insufficient documentation

## 2020-09-09 DIAGNOSIS — I773 Arterial fibromuscular dysplasia: Secondary | ICD-10-CM

## 2020-09-09 NOTE — Progress Notes (Signed)
MRN : [id number]  Christina Decker is a 50 y.o. (04-May-1971) female who presents with chief complaint of No chief complaint on file. Marland Kitchen  History of Present Illness:   The patient is seen for follow up evaluation of carotid stenosis. In the past there is a concern for FMD of the carotid which has been asymptomatic.  She does note that she still has episodes of dizziness but these are at baseline.  The carotid stenosis followed by ultrasound. Initall the stenosis was identified after a syncope spell which lead to ordering a Duplex ultrasound.  She denies any syncope since her last visit.  The patient denies amaurosis fugax. There is no recent history of TIA symptoms or focal motor deficits. There is no prior documented CVA.  The patient is taking enteric-coated aspirin 81 mg daily.  There is no history of migraine headaches. There is no history of seizures.  The patient has a history of coronary artery disease, no recent episodes of angina or shortness of breath. The patient denies PAD or claudication symptoms. There is a history of hyperlipidemia which is being treated with a statin.   Carotid Duplex done today shows <30% bilateral ICA stenosis.  This is unchanged compared to the study last year.    Duplex ultrasound of the renal arteries is normal, no evidence of renal artery FMD or renal artery stenosis.  No outpatient medications have been marked as taking for the 09/12/20 encounter (Appointment) with Delana Meyer, Dolores Lory, MD.    Past Medical History:  Diagnosis Date  . Abnormal heart rhythms   . Abnormal thyroid stimulating hormone level   . Anxiety   . Balance problem   . Cervical disc disease   . Depression   . Diabetes mellitus without complication ([place])   . Dyslipidemia   . Dyspareunia due to medical condition in female   . Dysrhythmia   . Environmental and seasonal allergies   . Fatty liver disease, nonalcoholic   . Fibrocystic breast disease   . Fibromuscular  hyperplasia ([place])   . Fibromuscular hyperplasia ([place])   . Fibromyalgia   . Fibromyalgia   . Hemorrhoid   . Hx of adenomatous colonic polyps   . Hyperkalemia   . Hypertension   . IBS (irritable bowel syndrome)   . IBS (irritable bowel syndrome)   . Low back pain   . Lower extremity edema   . Migraine   . Narcolepsy   . Narcolepsy and cataplexy   . Pain in left arm   . Pain in left shoulder   . Syncope   . Thyroid disease   . Vitamin D deficiency     Past Surgical History:  Procedure Laterality Date  . ABDOMINAL HYSTERECTOMY    . ANTERIOR CERVICAL DECOMP/DISCECTOMY FUSION N/A 10/16/2017   Procedure: ANTERIOR CERVICAL DECOMPRESSION/DISCECTOMY FUSION 2 LEVELS-C5-7, C6 CORPECTOMY;  Surgeon: Meade Maw, MD;  Location: [facility] ORS;  Service: Neurosurgery;  Laterality: N/A;  . HEMORRHOID SURGERY    . HERNIA REPAIR      Social History Social History   Tobacco Use  . Smoking status: Never Smoker  . Smokeless tobacco: Never Used  Vaping Use  . Vaping Use: Never used  Substance Use Topics  . Alcohol use: Yes    Alcohol/week: 2.0 standard drinks    Types: 2 Glasses of wine per week    Comment: occasional  . Drug use: No    Family History No family history on file.  Allergies  Allergen Reactions  .  Amoxicillin-Pot Clavulanate Other (See Comments)    Patient is prone to getting infections on medication, like UTIs     REVIEW OF SYSTEMS (Negative unless checked)  Constitutional: [] Weight loss  [] Fever  [] Chills Cardiac: [] Chest pain   [] Chest pressure   [] Palpitations   [] Shortness of breath when laying flat   [] Shortness of breath with exertion. Vascular:  [] Pain in legs with walking   [] Pain in legs at rest  [] History of DVT   [] Phlebitis   [] Swelling in legs   [] Varicose veins   [] Non-healing ulcers Pulmonary:   [] Uses home oxygen   [] Productive cough   [] Hemoptysis   [] Wheeze  [] COPD   [] Asthma Neurologic:  [x] Dizziness   [] Seizures   [] History of stroke    [] History of TIA  [] Aphasia   [] Vissual changes   [] Weakness or numbness in arm   [] Weakness or numbness in leg Musculoskeletal:   [] Joint swelling   [] Joint pain   [] Low back pain Hematologic:  [] Easy bruising  [] Easy bleeding   [] Hypercoagulable state   [] Anemic Gastrointestinal:  [] Diarrhea   [] Vomiting  [] Gastroesophageal reflux/heartburn   [] Difficulty swallowing. Genitourinary:  [] Chronic kidney disease   [] Difficult urination  [] Frequent urination   [] Blood in urine Skin:  [] Rashes   [] Ulcers  Psychological:  [] History of anxiety   []  History of major depression.  Physical Examination  There were no vitals filed for this visit. There is no height or weight on file to calculate BMI. Gen: WD/WN, NAD Head: [place]/AT, No temporalis wasting.  Ear/Nose/Throat: Hearing grossly intact, nares w/o erythema or drainage Eyes: PER, EOMI, sclera nonicteric.  Neck: Supple, no large masses.   Pulmonary:  Good air movement, no audible wheezing bilaterally, no use of accessory muscles.  Cardiac: RRR, no JVD Vascular:  No carotid bruits Vessel Right Left  Radial Palpable Palpable  Brachial Palpable Palpable  Carotid Palpable Palpable  Gastrointestinal: Non-distended. No guarding/no peritoneal signs.  Musculoskeletal: M/S 5/5 throughout.  No deformity or atrophy.  Neurologic: CN 2-12 intact. Symmetrical.  Speech is fluent. Motor exam as listed above. Psychiatric: Judgment intact, Mood & affect appropriate for pt's clinical situation. Dermatologic: No rashes or ulcers noted.  No changes consistent with cellulitis.  CBC Lab Results  Component Value Date   WBC 10.2 11/19/2018   HGB 13.2 11/19/2018   HCT 40.8 11/19/2018   MCV 88.3 11/19/2018   PLT 432 (H) 11/19/2018    BMET    Component Value Date/Time   NA 139 11/19/2018 1359   NA 137 05/11/2013 1206   K 3.2 (L) 11/19/2018 1359   K 4.5 05/11/2013 1433   CL 101 11/19/2018 1359   CL 107 05/11/2013 1206   CO2 28 11/19/2018 1359   CO2 25  05/11/2013 1206   GLUCOSE 105 (H) 11/19/2018 1359   GLUCOSE 122 (H) 05/11/2013 1206   BUN 8 11/19/2018 1359   BUN 12 05/11/2013 1206   CREATININE 0.84 11/19/2018 1359   CREATININE 1.13 05/11/2013 1206   CALCIUM 9.3 11/19/2018 1359   CALCIUM 8.9 05/11/2013 1206   GFRNONAA >60 11/19/2018 1359   GFRNONAA 59 (L) 05/11/2013 1206   GFRAA >60 11/19/2018 1359   GFRAA >60 05/11/2013 1206   CrCl cannot be calculated (Patient's most recent lab result is older than the maximum 21 days allowed.).  COAG Lab Results  Component Value Date   INR 0.94 10/09/2017    Radiology No results found.   Assessment/Plan 1. Bilateral carotid artery stenosis Recommend:  Given the patient's asymptomatic  subcritical stenosis no further invasive testing or surgery at this time.  Duplex ultrasound shows <30% stenosis bilaterally.  Continue antiplatelet therapy as prescribed Continue management of CAD, HTN and Hyperlipidemia Healthy heart diet,  encouraged exercise at least 4 times per week Follow up in 12 months with duplex ultrasound and physical exam  - VAS US CAROTID; Future  2. Fibromuscular dysplasia ([place]) See #1  3. Essential hypertension Continue antihypertensive medications as already ordered, these medications have been reviewed and there are no changes at this time.   4. Primary osteoarthritis involving multiple joints Continue NSAID medications as already ordered, these medications have been reviewed and there are no changes at this time.  Continued activity and therapy was stressed.     Hortencia Pilar, MD  09/09/2020 2:49 PM

## 2020-09-12 ENCOUNTER — Ambulatory Visit (INDEPENDENT_AMBULATORY_CARE_PROVIDER_SITE_OTHER): Payer: Medicare Other | Admitting: Vascular Surgery

## 2020-09-12 ENCOUNTER — Encounter (INDEPENDENT_AMBULATORY_CARE_PROVIDER_SITE_OTHER): Payer: Self-pay | Admitting: Vascular Surgery

## 2020-09-12 ENCOUNTER — Ambulatory Visit (INDEPENDENT_AMBULATORY_CARE_PROVIDER_SITE_OTHER): Payer: Medicare Other

## 2020-09-12 ENCOUNTER — Other Ambulatory Visit: Payer: Self-pay

## 2020-09-12 VITALS — BP 125/83 | HR 78 | Resp 16 | Wt 199.0 lb

## 2020-09-12 DIAGNOSIS — M159 Polyosteoarthritis, unspecified: Secondary | ICD-10-CM

## 2020-09-12 DIAGNOSIS — I6523 Occlusion and stenosis of bilateral carotid arteries: Secondary | ICD-10-CM

## 2020-09-12 DIAGNOSIS — M8949 Other hypertrophic osteoarthropathy, multiple sites: Secondary | ICD-10-CM

## 2020-09-12 DIAGNOSIS — I6529 Occlusion and stenosis of unspecified carotid artery: Secondary | ICD-10-CM | POA: Insufficient documentation

## 2020-09-12 DIAGNOSIS — E079 Disorder of thyroid, unspecified: Secondary | ICD-10-CM | POA: Insufficient documentation

## 2020-09-12 DIAGNOSIS — I773 Arterial fibromuscular dysplasia: Secondary | ICD-10-CM

## 2020-09-12 DIAGNOSIS — I10 Essential (primary) hypertension: Secondary | ICD-10-CM

## 2020-09-12 DIAGNOSIS — I779 Disorder of arteries and arterioles, unspecified: Secondary | ICD-10-CM | POA: Diagnosis not present

## 2020-09-12 DIAGNOSIS — K589 Irritable bowel syndrome without diarrhea: Secondary | ICD-10-CM | POA: Insufficient documentation

## 2020-09-12 DIAGNOSIS — G43909 Migraine, unspecified, not intractable, without status migrainosus: Secondary | ICD-10-CM | POA: Insufficient documentation

## 2021-06-05 ENCOUNTER — Emergency Department (HOSPITAL_COMMUNITY)
Admission: EM | Admit: 2021-06-05 | Discharge: 2021-06-05 | Disposition: A | Discharge status: Home / Self Care | Payer: Medicare Other | Attending: Emergency Medicine | Admitting: Emergency Medicine

## 2021-06-05 ENCOUNTER — Emergency Department (HOSPITAL_COMMUNITY): Payer: Medicare Other

## 2021-06-05 ENCOUNTER — Other Ambulatory Visit: Payer: Self-pay

## 2021-06-05 ENCOUNTER — Encounter (HOSPITAL_COMMUNITY): Payer: Self-pay | Admitting: *Deleted

## 2021-06-05 DIAGNOSIS — R079 Chest pain, unspecified: Secondary | ICD-10-CM | POA: Insufficient documentation

## 2021-06-05 DIAGNOSIS — Z79899 Other long term (current) drug therapy: Secondary | ICD-10-CM | POA: Diagnosis not present

## 2021-06-05 DIAGNOSIS — Z7984 Long term (current) use of oral hypoglycemic drugs: Secondary | ICD-10-CM | POA: Insufficient documentation

## 2021-06-05 DIAGNOSIS — I10 Essential (primary) hypertension: Secondary | ICD-10-CM | POA: Diagnosis not present

## 2021-06-05 DIAGNOSIS — E876 Hypokalemia: Secondary | ICD-10-CM | POA: Diagnosis not present

## 2021-06-05 DIAGNOSIS — M25512 Pain in left shoulder: Secondary | ICD-10-CM

## 2021-06-05 DIAGNOSIS — Z7982 Long term (current) use of aspirin: Secondary | ICD-10-CM | POA: Diagnosis not present

## 2021-06-05 DIAGNOSIS — E119 Type 2 diabetes mellitus without complications: Secondary | ICD-10-CM | POA: Insufficient documentation

## 2021-06-05 LAB — BASIC METABOLIC PANEL
Anion gap: 7 (ref 5–15)
BUN: 11 mg/dL (ref 6–20)
CO2: 27 mmol/L (ref 22–32)
Calcium: 9 mg/dL (ref 8.9–10.3)
Chloride: 105 mmol/L (ref 98–111)
Creatinine, Ser: 0.82 mg/dL (ref 0.44–1.00)
GFR, Estimated: >60 mL/min (ref >60)
Glucose, Bld: 131 mg/dL — ABNORMAL HIGH (ref 70–99)
Potassium: 3.1 mmol/L — ABNORMAL LOW (ref 3.5–5.1)
Sodium: 139 mmol/L (ref 135–145)

## 2021-06-05 LAB — CBC
HCT: 39.2 % (ref 36.0–46.0)
Hemoglobin: 13.1 g/dL (ref 12.0–15.0)
MCH: 29.8 pg (ref 26.0–34.0)
MCHC: 33.4 g/dL (ref 30.0–36.0)
MCV: 89.1 fL (ref 80.0–100.0)
Platelets: 415 10*3/uL — ABNORMAL HIGH (ref 150–400)
RBC: 4.4 MIL/uL (ref 3.87–5.11)
RDW: 13.3 % (ref 11.5–15.5)
WBC: 9.3 10*3/uL (ref 4.0–10.5)
nRBC: 0 % (ref 0.0–0.2)

## 2021-06-05 LAB — TROPONIN I (HIGH SENSITIVITY): Troponin I (High Sensitivity): 3 ng/L (ref <18)

## 2021-06-05 MED ORDER — HYDROMORPHONE HCL 1 MG/ML IJ SOLN
0.5000 mg | Freq: Once | INTRAMUSCULAR | Status: AC
  Administered 2021-06-05: 15:00:00 0.5 mg via INTRAVENOUS
  Filled 2021-06-05: qty 1

## 2021-06-05 MED ORDER — KETOROLAC TROMETHAMINE 30 MG/ML IJ SOLN
30.0000 mg | Freq: Once | INTRAMUSCULAR | Status: AC
  Administered 2021-06-05: 15:00:00 30 mg via INTRAVENOUS
  Filled 2021-06-05: qty 1

## 2021-06-05 MED ORDER — ONDANSETRON HCL 4 MG/2ML IJ SOLN
4.0000 mg | Freq: Once | INTRAMUSCULAR | Status: AC
  Administered 2021-06-05: 14:00:00 4 mg via INTRAVENOUS
  Filled 2021-06-05: qty 2

## 2021-06-05 MED ORDER — POTASSIUM CHLORIDE CRYS ER 20 MEQ PO TBCR: 20.0000 meq | EXTENDED_RELEASE_TABLET | Freq: Two times a day (BID) | ORAL | 0 refills | Status: DC

## 2021-06-05 MED ORDER — GABAPENTIN 100 MG PO CAPS: 100.0000 mg | ORAL_CAPSULE | Freq: Three times a day (TID) | ORAL | 0 refills | Status: DC

## 2021-06-05 MED ORDER — MORPHINE SULFATE (PF) 4 MG/ML IV SOLN
4.0000 mg | Freq: Once | INTRAVENOUS | Status: AC
  Administered 2021-06-05: 14:00:00 4 mg via INTRAVENOUS
  Filled 2021-06-05: qty 1

## 2021-06-05 MED ORDER — OXYCODONE HCL 5 MG PO TABS: 5.0000 mg | ORAL_TABLET | Freq: Four times a day (QID) | ORAL | 0 refills | Status: DC | PRN

## 2021-06-05 NOTE — ED Provider Notes (Signed)
[facility] EMERGENCY DEPARTMENT Provider Note   CSN: [id number] Arrival date & time: 06/05/21  1215    History Chief Complaint  Patient presents with   Chest Pain    Christina Decker is a 50 y.o. female with past medical history significant for diabetes, hypertension, fibromuscular dysplasia, fibromyalgia who presents for evaluation of left arm and chest pain over the last 2 days.  Pain worse when she goes to move her left arm.  No associated nausea, vomiting.  Symptoms are nonexertional in nature.  No paresthesias, weakness, weakness, falls or injury.  No midline neck pain, back pain, lower extremity swelling or pain.  No history of PE or DVT.  Pain is constant in nature.  Rates a 10/10.  No overlying redness, warmth to shoulder or chest.  Denies additional rating or alleviating factors.  History obtained from patient and past medical records.  No interpreter is used.  HPI  HPI: A 50 year old patient with a history of treated diabetes, hypertension and obesity presents for evaluation of chest pain. Initial onset of pain was more than 6 hours ago. The patient's chest pain is not worse with exertion. The patient's chest pain is middle- or left-sided, is not well-localized, is not described as heaviness/pressure/tightness, is not sharp and does not radiate to the arms/jaw/neck. The patient does not complain of nausea and denies diaphoresis. The patient has no history of stroke, has no history of peripheral artery disease, has not smoked in the past 90 days, has no relevant family history of coronary artery disease (first degree relative at less than age 9) and has no history of hypercholesterolemia.   Past Medical History:  Diagnosis Date   Abnormal heart rhythms    Abnormal thyroid stimulating hormone level    Anxiety    Balance problem    Cervical disc disease    Depression    Diabetes mellitus without complication (HCC)    Dyslipidemia    Dyspareunia due to medical condition in female     Dysrhythmia    Environmental and seasonal allergies    Fatty liver disease, nonalcoholic    Fibrocystic breast disease    Fibromuscular hyperplasia (HCC)    Fibromuscular hyperplasia (HCC)    Fibromyalgia    Fibromyalgia    Hemorrhoid    Hx of adenomatous colonic polyps    Hyperkalemia    Hypertension    IBS (irritable bowel syndrome)    IBS (irritable bowel syndrome)    Low back pain    Lower extremity edema    Migraine    Narcolepsy    Narcolepsy and cataplexy    Pain in left arm    Pain in left shoulder    Syncope    Thyroid disease    Vitamin D deficiency     Patient Active Problem List   Diagnosis Date Noted   Carotid artery occlusion 09/12/2020   IBS (irritable bowel syndrome) 09/12/2020   Migraines 09/12/2020   Thyroid disease 09/12/2020   Carotid stenosis 09/09/2020   Cervical myelopathy ([place]) 10/16/2017   Essential hypertension 07/19/2016   Fibromyalgia 07/19/2016   Fibromuscular dysplasia ([place]) 07/19/2016   DJD (degenerative joint disease) 07/19/2016   Bilateral low back pain without sciatica 04/12/2014   Anxiety and depression 02/18/2014   Hyperlipidemia, mixed 02/18/2014   Narcolepsy 02/18/2014   Obesity 02/18/2014   Chest pain syndrome 02/12/2014    Past Surgical History:  Procedure Laterality Date   ABDOMINAL HYSTERECTOMY     ANTERIOR CERVICAL DECOMP/DISCECTOMY FUSION N/A  10/16/2017   Procedure: ANTERIOR CERVICAL DECOMPRESSION/DISCECTOMY FUSION 2 LEVELS-C5-7, C6 CORPECTOMY;  Surgeon: Meade Maw, MD;  Location: [facility] ORS;  Service: Neurosurgery;  Laterality: N/A;   HEMORRHOID SURGERY     HERNIA REPAIR       OB History   No obstetric history on file.     Family History  Problem Relation Age of Onset   Hyperlipidemia Mother    Hypertension Mother    Multiple myeloma Mother    Hyperlipidemia Father    Hypertension Father    COPD Father    Hypertension Maternal Aunt    Hypertension Maternal Uncle    Hypertension Paternal Aunt     Hypertension Paternal Uncle    Aneurysm Paternal Grandmother     Social History   Tobacco Use   Smoking status: Never   Smokeless tobacco: Never  Vaping Use   Vaping Use: Never used  Substance Use Topics   Alcohol use: Yes    Alcohol/week: 2.0 standard drinks    Types: 2 Glasses of wine per week    Comment: occasional   Drug use: No    Home Medications Prior to Admission medications   Medication Sig Start Date End Date Taking? Authorizing Provider  gabapentin (NEURONTIN) 100 MG capsule Take 1 capsule (100 mg total) by mouth 3 (three) times daily for 7 days. 06/05/21 06/12/21 Yes Jeovanni Heuring A, PA-C  oxyCODONE (ROXICODONE) 5 MG immediate release tablet Take 1 tablet (5 mg total) by mouth every 6 (six) hours as needed for severe pain. 06/05/21  Yes Dorrance Sellick A, PA-C  potassium chloride SA (KLOR-CON M) 20 MEQ tablet Take 1 tablet (20 mEq total) by mouth 2 (two) times daily for 7 days. 06/05/21 06/12/21 Yes Waseem Suess A, PA-C  acetaminophen (TYLENOL) 500 MG tablet Take 1,000 mg by mouth every 6 (six) hours as needed for moderate pain.    [provider]  amLODipine (NORVASC) 5 MG tablet Take 5 mg by mouth daily. 01/12/14   [provider]  amphetamine-dextroamphetamine (ADDERALL XR) 15 MG 24 hr capsule Take 15 mg by mouth daily.    [provider]  aspirin EC 81 MG tablet Take 1 tablet by mouth daily.    [provider]  atorvastatin (LIPITOR) 40 MG tablet Take 40 mg by mouth every evening.    [provider]  cyclobenzaprine (FLEXERIL) 10 MG tablet TAKE 1 TABLET BY MOUTH TWICE DAILY AS NEEDED FOR MUSCLE SPASM MAY CAUSE DROWSINESS 03/30/19   [provider]  diazepam (VALIUM) 10 MG tablet Take 10 mg by mouth every 6 (six) hours as needed for anxiety.  09/25/13   [provider]  DULoxetine (CYMBALTA) 60 MG capsule Take 60 mg by mouth daily. *May take an additional dose if needed for anxiety* 12/21/13   [provider]  hydrochlorothiazide (HYDRODIURIL) 12.5 MG tablet Take 12.5 mg by mouth daily. 01/17/14   [provider]  lisinopril (PRINIVIL,ZESTRIL) 40 MG tablet Take 40 mg by mouth daily. 01/17/14   [provider]  metFORMIN (GLUCOPHAGE-XR) 500 MG 24 hr tablet Take 500 mg by mouth 2 (two) times daily.     [provider]  methocarbamol (ROBAXIN) 500 MG tablet Take 1 tablet (500 mg total) by mouth every 6 (six) hours as needed for muscle spasms. 10/17/17   Marin Olp, PA-C  metoprolol (LOPRESSOR) 100 MG tablet Take 100 mg by mouth 2 (two) times daily. 12/21/13   [provider]  omeprazole (PRILOSEC) 20 MG capsule  Take 20 mg by mouth daily as needed.    [provider]  oxybutynin (DITROPAN XL) 15 MG 24 hr tablet Take 15 mg by mouth at bedtime.    [provider]  polyethylene glycol powder (GLYCOLAX/MIRALAX) 17 GM/SCOOP powder MIX ONE CAPFUL IN 8 OZ OF WATER BY MOUTH DAILY FOR CONSTIPATION 04/30/19   [provider]    Allergies    Amoxicillin-pot clavulanate  Review of Systems   Review of Systems  Constitutional: Negative.   HENT: Negative.    Respiratory: Negative.    Cardiovascular:  Positive for chest pain.  Gastrointestinal: Negative.   Genitourinary: Negative.   Musculoskeletal: Negative.        Left shoulder pain  Skin: Negative.   Neurological: Negative.   All other systems reviewed and are negative.  Physical Exam Updated Vital Signs BP (!) 146/95    Pulse 84    Temp 98.2 F (36.8 C) (Oral)    Resp 20    SpO2 97%   Physical Exam Vitals and nursing note reviewed.  Constitutional:      General: She is not in acute distress.    Appearance: She is well-developed. She is not ill-appearing, toxic-appearing or diaphoretic.  HENT:     Head: Atraumatic.  Eyes:     Pupils: Pupils are equal, round, and reactive to light.  Neck:     Comments: No bony tenderness, full range of motion. Cardiovascular:     Rate and  Rhythm: Normal rate.     Pulses:          Radial pulses are 2+ on the right side and 2+ on the left side.       Dorsalis pedis pulses are 2+ on the right side and 2+ on the left side.     Heart sounds: Normal heart sounds.  Pulmonary:     Effort: Pulmonary effort is normal. No respiratory distress.     Breath sounds: Normal breath sounds.     Comments: Clear bilaterally, speaks in full sentences without difficulty Chest:     Comments: Nontender Abdominal:     General: Bowel sounds are normal. There is no distension.     Palpations: Abdomen is soft.     Comments: Soft, nontender  Musculoskeletal:     Right shoulder: Normal.     Left shoulder: Tenderness present. No swelling, deformity, effusion, laceration, bony tenderness or crepitus. Decreased range of motion.     Right upper arm: Normal.     Left upper arm: Normal.     Right elbow: Normal.     Left elbow: Normal.     Right forearm: Normal.     Left forearm: Normal.     Right wrist: Normal.     Left wrist: Normal.     Cervical back: Normal range of motion.     Right lower leg: No tenderness. No edema.     Left lower leg: No tenderness. No edema.     Comments: No midline tenderness. Diffuse tenderness left shoulder and arm.  Difficulty with ROM due to pain.  Compartments soft.  No overlying edema, erythema or warmth  Skin:    General: Skin is warm and dry.     Capillary Refill: Capillary refill takes less than 2 seconds.     Comments: No edema, erythema or warmth.  No fluctuance or induration.  Neurological:     General: No focal deficit present.     Mental Status: She is alert.  Sensory: Sensation is intact.     Motor: Motor function is intact.     Comments: Equal handgrip bilaterally, intact sensation  Psychiatric:        Mood and Affect: Mood normal.   ED Results / Procedures / Treatments   Labs (all labs ordered are listed, but only abnormal results are displayed) Labs Reviewed  BASIC METABOLIC PANEL - Abnormal;  Notable for the following components:      Result Value   Potassium 3.1 (*)    Glucose, Bld 131 (*)    All other components within normal limits  CBC - Abnormal; Notable for the following components:   Platelets 415 (*)    All other components within normal limits  POC URINE PREG, ED  TROPONIN I (HIGH SENSITIVITY)    EKG EKG Interpretation  Date/Time:  Monday June 05 2021 12:26:43 EST Ventricular Rate:  97 PR Interval:  144 QRS Duration: 76 QT Interval:  340 QTC Calculation: 431 R Axis:   54 Text Interpretation: Normal sinus rhythm Cannot rule out Anterior infarct , age undetermined Confirmed by Octaviano Glow [phone]) on 06/05/2021 2:15:09 PM  Radiology DG Chest 2 View  Result Date: 06/05/2021 CLINICAL DATA:  Left-sided chest pain over the last 2 days. Pain radiates to the left arm. EXAM: CHEST - 2 VIEW COMPARISON:  None. FINDINGS: The heart size and mediastinal contours are within normal limits. Both lungs are clear. The visualized skeletal structures are unremarkable. IMPRESSION: No active cardiopulmonary disease. Electronically Signed   By: Nelson Chimes M.D.   On: 06/05/2021 13:34    Procedures Procedures   Medications Ordered in ED Medications  morphine 4 MG/ML injection 4 mg (4 mg Intravenous Given 06/05/21 1412)  ondansetron (ZOFRAN) injection 4 mg (4 mg Intravenous Given 06/05/21 1411)  HYDROmorphone (DILAUDID) injection 0.5 mg (0.5 mg Intravenous Given 06/05/21 1526)  ketorolac (TORADOL) 30 MG/ML injection 30 mg (30 mg Intravenous Given 06/05/21 1525)    ED Course  I have reviewed the triage vital signs and the nursing notes.  Pertinent labs & imaging results that were available during my care of the patient were reviewed by me and considered in my medical decision making (see chart for details).  Pleasant 50 year old here for evaluation of left change shoulder pain which began 2 days ago.  No recent injury or trauma.  Has had similar episodes previously.   Chest pain nonexertional, nonpleuritic in nature.  No associated diaphoresis, nausea, vomiting, paresthesias or weakness.  She has pain with movement to her left upper extremity.  No midline cervical thoracic tenderness.  She is neurovascularly intact.  Her compartments are soft.  She has no clinical evidence of VTE.  Heart and lungs clear.  Does have chronic pain issues at baseline.  Labs and imaging personally reviewed and interpreted:  CBC without leukocytosis 9 metabolic panel mild hypokalemia at 3.1 will write for supplementation Troponin negative Chest x-ray without any significant infiltrates, cardiomegaly, pulm edema, pneumothorax EKG without significant change  Heart score 3, Wells criteria low risk.  High suspicion for neuropathy versus MSK etiology of Sx.  At this time I low suspicion for acute ACS, PE, dissection, infectious process, acute neurosurgical emergency, VTE, septic joint, fx, disslocation as cause of her symptoms.  Will write for some medications for her to take at home.  Discussed close help with cardiology, PCP.  She is agreeable.  Patient is to be discharged with recommendation to follow up with PCP in regards to today's hospital visit. Chest pain  is not likely of cardiac or pulmonary etiology d/t presentation, PERC negative, VSS, no tracheal deviation, no JVD or new murmur, RRR, breath sounds equal bilaterally, EKG without acute abnormalities, negative troponin, and negative CXR. Pt has been advised to return to the ED if CP becomes exertional, associated with diaphoresis or nausea, radiates to left jaw/arm, worsens or becomes concerning in any way. Pt appears reliable for follow up and is agreeable to discharge.    Clinical Course as of 06/05/21 1537  Mon Jun 05, 2021  1437 This is a 50 year old female with a history of chronic pain, fibromyalgia, presenting to the Emergency Department with left-sided chest pain.  She reports that she began having left chest and shoulder  pain a few days ago, abrupt onset, it is never had this, symptoms before.  She describes chest pressure and significant pain in her left arm and upper shoulder, worse with any arm movement.  It sometimes radiates down towards her leg.  She has had similar episodes of severe pain in other parts of her body, but never isolated on the left side like this.  She takes several medications for fibromyalgia and chronic pain.  On exam she is afebrile, mildly hypertensive, vital signs otherwise unremarkable.  Her heart beat is regular with no murmurs, lungs clear to auscultation.  She has no reproducible chest wall tenderness, but there is significant pain of the left shoulder and chest when I attempt to passively or actively raise her left arm.  Her grip strength is intact.  She has no other neurological type deficits.  I strongly suspect that this is related to peripheral nerve compression or impingement, or else related to her fibromyalgia or chronic pain syndrome.  The reproducibility of her symptoms do not suggest ACS.  Her ECG is reassuring with no acute ischemic findings my interpretation, and her troponin is 3 with multiple days of symptoms, which both suggest that ACS or PE is unlikely.  Her pain did improve with some morphine.  We will give her another dose of pain medication, can prescribe and start her on some gabapentin and pain management at home, and advised PCP follow-up.  Otherwise clinically think she is reasonably safe and stable for discharge [MT]  1440 I reviewed her x-ray which shows no evidence for pneumothorax or pneumonia. [MT]    Clinical Course User Index [MT] Wyvonnia Dusky, MD   MDM Rules/Calculators/A&P HEAR Score: 3                           Final Clinical Impression(s) / ED Diagnoses Final diagnoses:  Acute pain of left shoulder  Hypokalemia    Rx / DC Orders ED Discharge Orders          Ordered    oxyCODONE (ROXICODONE) 5 MG immediate release tablet  Every 6 hours PRN         06/05/21 1534    gabapentin (NEURONTIN) 100 MG capsule  3 times daily        06/05/21 1534    potassium chloride SA (KLOR-CON M) 20 MEQ tablet  2 times daily        06/05/21 1534             Analeigh Aries A, PA-C 06/05/21 1537    Wyvonnia Dusky, MD 06/05/21 1737

## 2021-06-05 NOTE — ED Triage Notes (Signed)
Hurts to move arms

## 2021-06-05 NOTE — Discharge Instructions (Addendum)
Your potassium level was low here.  We have started her medication.  Follow-up with primary care provider in 1 week for reevaluation we will discharge her on some pain medications.  These medications may become addictive and cause sedation.  Do not drive or operate heavy machinery while taking these medications.  Only take as prescribed.  Follow-up with cardiology.  Return for new or worsening symptoms.

## 2021-06-05 NOTE — ED Triage Notes (Signed)
Chest pain for 2 days

## 2021-08-22 ENCOUNTER — Other Ambulatory Visit: Payer: Self-pay

## 2021-09-11 ENCOUNTER — Ambulatory Visit (INDEPENDENT_AMBULATORY_CARE_PROVIDER_SITE_OTHER): Payer: Medicare Other | Admitting: Nurse Practitioner

## 2021-09-11 ENCOUNTER — Encounter (INDEPENDENT_AMBULATORY_CARE_PROVIDER_SITE_OTHER): Payer: Self-pay

## 2021-09-11 ENCOUNTER — Other Ambulatory Visit: Payer: Self-pay

## 2021-09-11 ENCOUNTER — Ambulatory Visit (INDEPENDENT_AMBULATORY_CARE_PROVIDER_SITE_OTHER): Payer: Medicare Other

## 2021-09-11 DIAGNOSIS — I6523 Occlusion and stenosis of bilateral carotid arteries: Secondary | ICD-10-CM

## 2021-11-07 ENCOUNTER — Other Ambulatory Visit: Payer: Self-pay

## 2021-11-07 NOTE — Progress Notes (Signed)
   Patient having gi issues scheduled for in person

## 2022-03-13 ENCOUNTER — Other Ambulatory Visit: Payer: Self-pay

## 2022-03-13 ENCOUNTER — Encounter: Payer: Self-pay | Admitting: Gastroenterology

## 2022-03-13 ENCOUNTER — Ambulatory Visit (INDEPENDENT_AMBULATORY_CARE_PROVIDER_SITE_OTHER): Payer: Medicare Other | Admitting: Gastroenterology

## 2022-03-13 VITALS — BP 129/80 | HR 92 | Temp 98.3°F | Ht 65.0 in | Wt 187.6 lb

## 2022-03-13 DIAGNOSIS — Z8719 Personal history of other diseases of the digestive system: Secondary | ICD-10-CM

## 2022-03-13 DIAGNOSIS — K625 Hemorrhage of anus and rectum: Secondary | ICD-10-CM

## 2022-03-13 DIAGNOSIS — R0600 Dyspnea, unspecified: Secondary | ICD-10-CM | POA: Insufficient documentation

## 2022-03-13 DIAGNOSIS — R0609 Other forms of dyspnea: Secondary | ICD-10-CM | POA: Insufficient documentation

## 2022-03-13 MED ORDER — NA SULFATE-K SULFATE-MG SULF 17.5-3.13-1.6 GM/177ML PO SOLN
354.0000 mL | Freq: Once | ORAL | 0 refills | Status: AC
Start: 2022-03-13 — End: 2022-03-13

## 2022-03-13 NOTE — Progress Notes (Signed)
Jonathon Bellows MD, MRCP(U.K) [address]  [place]  [place], [place] [address]  Main: [phone]  Fax: [phone]   Gastroenterology Consultation  Referring Provider:     Marguerita Merles, MD Primary Care Physician:  Marguerita Merles, MD Primary Gastroenterologist:  Dr. Jonathon Bellows  Reason for Consultation:     Hemorrhoids        HPI:   Christina Decker is a 51 y.o. y/o female referred for consultation & management  by Dr. Lennox Grumbles, Connye Burkitt, MD.    She says that she has had a colonoscopy many years back was found to have hemorrhoids underwent surgery for the same and subsequently had further bleeding and has undergone some form of banding cannot recollect on the rounds of treatment or how many bands were placed.  If he continues to have prolapsing hemorrhoids based on her history bleeding on the tissue paper toilet bowl.  Always associated with a bowel movement states she fluctuates between constipation and diarrhea of late more diarrhea after she was started on metformin.  No family history of colon cancer she says she is due for a colonoscopy.   Past Medical History:  Diagnosis Date   Abnormal heart rhythms    Abnormal thyroid stimulating hormone level    Anxiety    Balance problem    Cervical disc disease    Depression    Diabetes mellitus without complication (HCC)    Dyslipidemia    Dyspareunia due to medical condition in female    Dysrhythmia    Environmental and seasonal allergies    Fatty liver disease, nonalcoholic    Fibrocystic breast disease    Fibromuscular hyperplasia (HCC)    Fibromuscular hyperplasia (HCC)    Fibromyalgia    Fibromyalgia    Hemorrhoid    Hx of adenomatous colonic polyps    Hyperkalemia    Hypertension    IBS (irritable bowel syndrome)    IBS (irritable bowel syndrome)    Low back pain    Lower extremity edema    Migraine    Narcolepsy    Narcolepsy and cataplexy    Pain in left arm    Pain in left shoulder    Syncope    Thyroid  disease    Vitamin D deficiency     Past Surgical History:  Procedure Laterality Date   ABDOMINAL HYSTERECTOMY     ANTERIOR CERVICAL DECOMP/DISCECTOMY FUSION N/A 10/16/2017   Procedure: ANTERIOR CERVICAL DECOMPRESSION/DISCECTOMY FUSION 2 LEVELS-C5-7, C6 CORPECTOMY;  Surgeon: Meade Maw, MD;  Location: [facility] ORS;  Service: Neurosurgery;  Laterality: N/A;   HEMORRHOID SURGERY     HERNIA REPAIR      Prior to Admission medications   Medication Sig Start Date End Date Taking? Authorizing Provider  acetaminophen (TYLENOL) 500 MG tablet Take 1,000 mg by mouth every 6 (six) hours as needed for moderate pain.    [provider]  amLODipine (NORVASC) 5 MG tablet Take 1 tablet by mouth daily.    [provider]  amphetamine-dextroamphetamine (ADDERALL XR) 15 MG 24 hr capsule Take 15 mg by mouth daily.    [provider]  Aspirin 81 MG CAPS Take 1 tablet by mouth.    [provider]  busPIRone (BUSPAR) 10 MG tablet Take 1 tablet by mouth daily.    [provider]  Cholecalciferol (VITAMIN D3) 50 MCG (2000 UT) CAPS Take 1 capsule by mouth. 10/28/21   [provider]  cyclobenzaprine (FLEXERIL) 10 MG  tablet Take 1 tablet by mouth in the morning and at bedtime.    [provider]  diazepam (VALIUM) 10 MG tablet Take 10 mg by mouth every 6 (six) hours as needed for anxiety.  09/25/13   [provider]  diazepam (VALIUM) 10 MG tablet Take 1 tablet by mouth 3 (three) times daily.    [provider]  DULoxetine (CYMBALTA) 60 MG capsule Take 1 capsule by mouth daily.    [provider]  estradiol (ESTRACE) 0.1 MG/GM vaginal cream Place 1 g vaginally. 01/18/22   [provider]  gabapentin (NEURONTIN) 100 MG capsule Take 1 capsule by mouth daily. 11/02/21   [provider]  hydrochlorothiazide (HYDRODIURIL) 12.5 MG tablet Take 12.5 mg by mouth daily. 01/17/14   [provider]  lidocaine  (LIDODERM) 5 % 1 patch.    [provider]  Magnesium 250 MG TABS Take 1 tablet by mouth daily.    [provider]  metFORMIN (GLUCOPHAGE-XR) 750 MG 24 hr tablet Take 1 tablet by mouth daily.    [provider]  methocarbamol (ROBAXIN) 500 MG tablet Take 1 tablet (500 mg total) by mouth every 6 (six) hours as needed for muscle spasms. 10/17/17   Marin Olp, PA-C  metoprolol (LOPRESSOR) 100 MG tablet Take 100 mg by mouth 2 (two) times daily. 12/21/13   [provider]  mirabegron ER (MYRBETRIQ) 25 MG TB24 tablet Take 1 tablet by mouth daily. 12/20/21   [provider]  omeprazole (PRILOSEC) 20 MG capsule Take 20 mg by mouth daily as needed.    [provider]  omeprazole (PRILOSEC) 20 MG capsule Take 1 capsule by mouth daily.    [provider]  [facility] ULTRA test strip USE 1 STRIP TO [place] DAILY 01/11/22   [provider]  oxyCODONE (ROXICODONE) 5 MG immediate release tablet Take 1 tablet (5 mg total) by mouth every 6 (six) hours as needed for severe pain. 06/05/21   Henderly, Britni A, PA-C  polyethylene glycol powder (GLYCOLAX/MIRALAX) 17 GM/SCOOP powder MIX ONE CAPFUL IN 8 OZ OF WATER BY MOUTH DAILY FOR CONSTIPATION 04/30/19   [provider]  potassium chloride SA (KLOR-CON M) 20 MEQ tablet Take 1 tablet (20 mEq total) by mouth 2 (two) times daily for 7 days. 06/05/21 06/12/21  Henderly, Britni A, PA-C  rosuvastatin (CRESTOR) 20 MG tablet Take 1 tablet by mouth. 10/28/21   [provider]  SUMAtriptan (IMITREX) 25 MG tablet Take 1 tablet by mouth.    [provider]    Family History  Problem Relation Age of Onset   Hyperlipidemia Mother    Hypertension Mother    Multiple myeloma Mother    Hyperlipidemia Father    Hypertension Father    COPD Father    Hypertension Maternal Aunt    Hypertension Maternal Uncle    Hypertension Paternal Aunt    Hypertension Paternal Uncle     Aneurysm Paternal Grandmother      Social History   Tobacco Use   Smoking status: Never   Smokeless tobacco: Never  Vaping Use   Vaping Use: Never used  Substance Use Topics   Alcohol use: Yes    Alcohol/week: 2.0 standard drinks of alcohol    Types: 2 Glasses of wine per week    Comment: occasional   Drug use: No    Allergies as of 03/13/2022 - Review Complete 03/13/2022  Allergen Reaction Noted   Amoxicillin-pot clavulanate Other (See Comments)  11/19/2018   Diclofenac  03/13/2022   Estradiol  03/13/2022   Lisinopril  03/13/2022    Review of Systems:    All systems reviewed and negative except where noted in HPI.   Physical Exam:  BP 129/80   Pulse 92   Temp 98.3 F (36.8 C) (Oral)   Ht '5\' 5"'  (1.651 m)   Wt 187 lb 9.6 oz (85.1 kg)   BMI 31.22 kg/m  No LMP recorded. Patient has had a hysterectomy. Psych:  Alert and cooperative. Normal mood and affect. General:   Alert,  Well-developed, well-nourished, pleasant and cooperative in NAD Head:  Normocephalic and atraumatic. Eyes:  Sclera clear, no icterus.   Conjunctiva pink. Ears:  Normal auditory acuity. Neurologic:  Alert and oriented x3;  grossly normal neurologically. Psych:  Alert and cooperative. Normal mood and affect.  Imaging Studies: No results found.  Assessment and Plan:   Christina Decker is a 51 y.o. y/o female has been referred for hemorrhoids.  Prior history of possible surgery and banding with recurrence of bleeding.  Due for a colonoscopy  Plan 1.  Proceed with colonoscopy based on what we see we will discuss about next steps which could include banding versus embolization versus surgery  2.  Provided patient information about conservative management of hemorrhoids patient information printed out and given to her discussed about sitz bath and perianal hygiene   I have discussed alternative options, risks & benefits,  which include, but are not limited to, bleeding, infection,  perforation,respiratory complication & drug reaction.  The patient agrees with this plan & written consent will be obtained.     Follow up in 4-8 weeks  Dr Jonathon Bellows MD,MRCP(U.K)

## 2022-03-27 MED ORDER — NA SULFATE-K SULFATE-MG SULF 17.5-3.13-1.6 GM/177ML PO SOLN: 354.0000 mL | Freq: Once | ORAL | 0 refills | Status: AC

## 2022-03-27 NOTE — Addendum Note (Signed)
Addended by: Wayna Chalet on: 03/27/2022 05:00 PM   Modules accepted: Orders

## 2022-03-29 ENCOUNTER — Encounter
Admission: RE | Disposition: A | Discharge status: Home / Self Care | Payer: Self-pay | Source: Home / Self Care | Attending: Gastroenterology

## 2022-03-29 ENCOUNTER — Ambulatory Visit: Payer: Medicare Other | Admitting: Anesthesiology

## 2022-03-29 ENCOUNTER — Ambulatory Visit
Admission: RE | Admit: 2022-03-29 | Discharge: 2022-03-29 | Disposition: A | Discharge status: Home / Self Care | Payer: Medicare Other | Attending: Gastroenterology | Admitting: Gastroenterology

## 2022-03-29 ENCOUNTER — Encounter: Payer: Self-pay | Admitting: Gastroenterology

## 2022-03-29 DIAGNOSIS — E119 Type 2 diabetes mellitus without complications: Secondary | ICD-10-CM | POA: Diagnosis not present

## 2022-03-29 DIAGNOSIS — E785 Hyperlipidemia, unspecified: Secondary | ICD-10-CM | POA: Diagnosis not present

## 2022-03-29 DIAGNOSIS — E669 Obesity, unspecified: Secondary | ICD-10-CM | POA: Diagnosis not present

## 2022-03-29 DIAGNOSIS — K640 First degree hemorrhoids: Secondary | ICD-10-CM | POA: Diagnosis not present

## 2022-03-29 DIAGNOSIS — Z79899 Other long term (current) drug therapy: Secondary | ICD-10-CM | POA: Diagnosis not present

## 2022-03-29 DIAGNOSIS — G47411 Narcolepsy with cataplexy: Secondary | ICD-10-CM | POA: Insufficient documentation

## 2022-03-29 DIAGNOSIS — E039 Hypothyroidism, unspecified: Secondary | ICD-10-CM | POA: Diagnosis not present

## 2022-03-29 DIAGNOSIS — K625 Hemorrhage of anus and rectum: Secondary | ICD-10-CM | POA: Insufficient documentation

## 2022-03-29 DIAGNOSIS — K589 Irritable bowel syndrome without diarrhea: Secondary | ICD-10-CM | POA: Diagnosis not present

## 2022-03-29 DIAGNOSIS — D126 Benign neoplasm of colon, unspecified: Secondary | ICD-10-CM | POA: Diagnosis not present

## 2022-03-29 DIAGNOSIS — K760 Fatty (change of) liver, not elsewhere classified: Secondary | ICD-10-CM | POA: Insufficient documentation

## 2022-03-29 DIAGNOSIS — M797 Fibromyalgia: Secondary | ICD-10-CM | POA: Insufficient documentation

## 2022-03-29 DIAGNOSIS — I10 Essential (primary) hypertension: Secondary | ICD-10-CM | POA: Insufficient documentation

## 2022-03-29 DIAGNOSIS — K219 Gastro-esophageal reflux disease without esophagitis: Secondary | ICD-10-CM | POA: Insufficient documentation

## 2022-03-29 DIAGNOSIS — Z7984 Long term (current) use of oral hypoglycemic drugs: Secondary | ICD-10-CM | POA: Insufficient documentation

## 2022-03-29 DIAGNOSIS — D122 Benign neoplasm of ascending colon: Secondary | ICD-10-CM | POA: Diagnosis not present

## 2022-03-29 DIAGNOSIS — Z8719 Personal history of other diseases of the digestive system: Secondary | ICD-10-CM

## 2022-03-29 DIAGNOSIS — M1990 Unspecified osteoarthritis, unspecified site: Secondary | ICD-10-CM | POA: Diagnosis not present

## 2022-03-29 DIAGNOSIS — F419 Anxiety disorder, unspecified: Secondary | ICD-10-CM | POA: Insufficient documentation

## 2022-03-29 DIAGNOSIS — E079 Disorder of thyroid, unspecified: Secondary | ICD-10-CM | POA: Diagnosis not present

## 2022-03-29 HISTORY — PX: COLONOSCOPY WITH PROPOFOL: SHX5780

## 2022-03-29 LAB — GLUCOSE, CAPILLARY: Glucose-Capillary: 135 mg/dL — ABNORMAL HIGH (ref 70–99)

## 2022-03-29 SURGERY — COLONOSCOPY WITH PROPOFOL
Anesthesia: General

## 2022-03-29 MED ORDER — PROPOFOL 1000 MG/100ML IV EMUL
INTRAVENOUS | Status: AC
  Filled 2022-03-29: qty 100

## 2022-03-29 MED ORDER — PROPOFOL 10 MG/ML IV BOLUS
INTRAVENOUS | Status: DC | PRN
  Administered 2022-03-29: 20 mg via INTRAVENOUS
  Administered 2022-03-29: 50 mg via INTRAVENOUS

## 2022-03-29 MED ORDER — SODIUM CHLORIDE 0.9 % IV SOLN: INTRAVENOUS | Status: DC

## 2022-03-29 MED ORDER — PROPOFOL 500 MG/50ML IV EMUL
INTRAVENOUS | Status: DC | PRN
  Administered 2022-03-29: 150 ug/kg/min via INTRAVENOUS

## 2022-03-29 MED ORDER — LIDOCAINE HCL (CARDIAC) PF 100 MG/5ML IV SOSY
PREFILLED_SYRINGE | INTRAVENOUS | Status: DC | PRN
  Administered 2022-03-29: 50 mg via INTRAVENOUS

## 2022-03-29 MED ORDER — STERILE WATER FOR IRRIGATION IR SOLN
Status: DC | PRN
  Administered 2022-03-29: 60 mL

## 2022-03-29 NOTE — Anesthesia Postprocedure Evaluation (Signed)
Anesthesia Post Note  Patient: Christina Decker  Procedure(s) Performed: COLONOSCOPY WITH PROPOFOL  Patient location during evaluation: Endoscopy Anesthesia Type: General Level of consciousness: awake and alert Pain management: pain level controlled Vital Signs Assessment: post-procedure vital signs reviewed and stable Respiratory status: spontaneous breathing, nonlabored ventilation and respiratory function stable Cardiovascular status: blood pressure returned to baseline and stable Postop Assessment: no apparent nausea or vomiting Anesthetic complications: no   No notable events documented.   Last Vitals:  Vitals:   03/29/22 0821 03/29/22 0840  BP: 125/79 (!) 141/89  Pulse: 86   Resp: 16   Temp:  (!) 35.8 C  SpO2: 100%     Last Pain:  Vitals:   03/29/22 0840  TempSrc: Temporal  PainSc: 0-No pain                 [place] Ouch

## 2022-03-29 NOTE — Op Note (Signed)
[facility] Gastroenterology Patient Name: Christina Decker Procedure Date: 03/29/2022 7:17 AM MRN: [id number] Account #: [id number] Date of Birth: February 04, 1971 Admit Type: Outpatient Age: 51 Room: [facility] ENDO ROOM 4 Gender: Female Note Status: Finalized Instrument Name: Jasper Riling [id number] Procedure:             Colonoscopy Indications:           Rectal bleeding Providers:             Jonathon Bellows MD, MD Referring MD:          Danna Hefty (Referring MD) Medicines:             Monitored Anesthesia Care Complications:         No immediate complications. Procedure:             Pre-Anesthesia Assessment:                        - Prior to the procedure, a History and Physical was                         performed, and patient medications, allergies and                         sensitivities were reviewed. The patient's tolerance                         of previous anesthesia was reviewed.                        - ASA Grade Assessment: II - A patient with mild                         systemic disease.                        After obtaining informed consent, the colonoscope was                         passed under direct vision. Throughout the procedure,                         the patient's blood pressure, pulse, and oxygen                         saturations were monitored continuously. The                         Colonoscope was introduced through the anus and                         advanced to the the cecum, identified by the                         appendiceal orifice. The colonoscopy was performed                         with ease. The patient tolerated the procedure well.                         The quality of the  bowel preparation was excellent. Findings:      The perianal and digital rectal examinations were normal.      A 3 mm polyp was found in the ascending colon. The polyp was sessile.       The polyp was removed with a cold biopsy forceps. Resection and        retrieval were complete.      Non-bleeding internal hemorrhoids were found during retroflexion. The       hemorrhoids were small and Grade I (internal hemorrhoids that do not       prolapse).      The exam was otherwise without abnormality on direct and retroflexion       views. Impression:            - One 3 mm polyp in the ascending colon, removed with                         a cold biopsy forceps. Resected and retrieved.                        - Non-bleeding internal hemorrhoids.                        - The examination was otherwise normal on direct and                         retroflexion views. Recommendation:        - Discharge patient to home (with escort).                        - Resume previous diet.                        - Continue present medications.                        - Await pathology results.                        - Return to GI office in 1 week.                        - For hemorroidalo banding Procedure Code(s):     --- Professional ---                        [phone], Colonoscopy, flexible; with biopsy, single or                         multiple Diagnosis Code(s):     --- Professional ---                        K63.5, Polyp of colon                        K64.0, First degree hemorrhoids                        K62.5, Hemorrhage of anus and rectum CPT copyright 2019 American Medical Association. All rights reserved. The codes documented in this report are preliminary and upon coder review may  be revised to meet current  compliance requirements. Jonathon Bellows, MD Jonathon Bellows MD, MD 03/29/2022 8:19:59 AM This report has been signed electronically. Number of Addenda: 0 Note Initiated On: 03/29/2022 7:17 AM Scope Withdrawal Time: 0 hours 8 minutes 40 seconds  Total Procedure Duration: 0 hours 13 minutes 3 seconds  Estimated Blood Loss:  Estimated blood loss: none.      [facility]

## 2022-03-29 NOTE — Anesthesia Preprocedure Evaluation (Addendum)
Anesthesia Evaluation  Patient identified by MRN, date of birth, ID band Patient awake    Reviewed: Allergy & Precautions, NPO status , Patient's Chart, lab work & pertinent test results  Airway Mallampati: III  TM Distance: >3 FB Neck ROM: full    Dental  (+) Missing,    Pulmonary neg pulmonary ROS,    Pulmonary exam normal        Cardiovascular hypertension, Pt. on home beta blockers and Pt. on medications Normal cardiovascular exam  Carotid Stenosis   Neuro/Psych PSYCHIATRIC DISORDERS Anxiety    GI/Hepatic Neg liver ROS, GERD  Controlled and Medicated,  Endo/Other  diabetes, Type 2Hypothyroidism   Renal/GU negative Renal ROS  negative genitourinary   Musculoskeletal  (+) Arthritis , Fibromyalgia -  Abdominal (+) + obese,   Peds  Hematology negative hematology ROS (+)   Anesthesia Other Findings Past Medical History: No date: Abnormal heart rhythms No date: Abnormal thyroid stimulating hormone level No date: Anxiety No date: Balance problem No date: Cervical disc disease No date: Depression No date: Diabetes mellitus without complication (HCC) No date: Dyslipidemia No date: Dyspareunia due to medical condition in female No date: Dysrhythmia No date: Environmental and seasonal allergies No date: Fatty liver disease, nonalcoholic No date: Fibrocystic breast disease No date: Fibromuscular hyperplasia (HCC) No date: Fibromuscular hyperplasia (HCC) No date: Fibromyalgia No date: Fibromyalgia No date: Hemorrhoid No date: Hx of adenomatous colonic polyps No date: Hyperkalemia No date: Hypertension No date: IBS (irritable bowel syndrome) No date: IBS (irritable bowel syndrome) No date: Low back pain No date: Lower extremity edema No date: Migraine No date: Narcolepsy No date: Narcolepsy and cataplexy No date: Pain in left arm No date: Pain in left shoulder No date: Syncope No date: Thyroid disease No  date: Vitamin D deficiency  Past Surgical History: No date: ABDOMINAL HYSTERECTOMY 10/16/2017: ANTERIOR CERVICAL DECOMP/DISCECTOMY FUSION; N/A     Comment:  Procedure: ANTERIOR CERVICAL DECOMPRESSION/DISCECTOMY               FUSION 2 LEVELS-C5-7, C6 CORPECTOMY;  Surgeon: Meade Maw, MD;  Location: [facility] ORS;  Service: Neurosurgery;              Laterality: N/A; No date: HEMORRHOID SURGERY No date: HERNIA REPAIR     Reproductive/Obstetrics negative OB ROS                            Anesthesia Physical Anesthesia Plan  ASA: 2  Anesthesia Plan: General   Post-op Pain Management:    Induction: Intravenous  PONV Risk Score and Plan: Propofol infusion and TIVA  Airway Management Planned: Natural Airway  Additional Equipment:   Intra-op Plan:   Post-operative Plan:   Informed Consent: I have reviewed the patients History and Physical, chart, labs and discussed the procedure including the risks, benefits and alternatives for the proposed anesthesia with the patient or authorized representative who has indicated his/her understanding and acceptance.     Dental Advisory Given  Plan Discussed with: Anesthesiologist, CRNA and Surgeon  Anesthesia Plan Comments:        Anesthesia Quick Evaluation

## 2022-03-29 NOTE — H&P (Signed)
Jonathon Bellows, MD [address] [place], [place], [place], [address] [address], [place], [place], [place], [address] Phone: [phone]  Fax: [phone]  Primary Care Physician:  Danna Hefty, DO   Pre-Procedure History & Physical: HPI:  Christina Decker is a 51 y.o. female is here for an colonoscopy.   Past Medical History:  Diagnosis Date   Abnormal heart rhythms    Abnormal thyroid stimulating hormone level    Anxiety    Balance problem    Cervical disc disease    Depression    Diabetes mellitus without complication (HCC)    Dyslipidemia    Dyspareunia due to medical condition in female    Dysrhythmia    Environmental and seasonal allergies    Fatty liver disease, nonalcoholic    Fibrocystic breast disease    Fibromuscular hyperplasia (HCC)    Fibromuscular hyperplasia (HCC)    Fibromyalgia    Fibromyalgia    Hemorrhoid    Hx of adenomatous colonic polyps    Hyperkalemia    Hypertension    IBS (irritable bowel syndrome)    IBS (irritable bowel syndrome)    Low back pain    Lower extremity edema    Migraine    Narcolepsy    Narcolepsy and cataplexy    Pain in left arm    Pain in left shoulder    Syncope    Thyroid disease    Vitamin D deficiency     Past Surgical History:  Procedure Laterality Date   ABDOMINAL HYSTERECTOMY     ANTERIOR CERVICAL DECOMP/DISCECTOMY FUSION N/A 10/16/2017   Procedure: ANTERIOR CERVICAL DECOMPRESSION/DISCECTOMY FUSION 2 LEVELS-C5-7, C6 CORPECTOMY;  Surgeon: Meade Maw, MD;  Location: [facility] ORS;  Service: Neurosurgery;  Laterality: N/A;   HEMORRHOID SURGERY     HERNIA REPAIR      Prior to Admission medications   Medication Sig Start Date End Date Taking? Authorizing Provider  amLODipine (NORVASC) 5 MG tablet Take 1 tablet by mouth daily.   Yes [provider]  amphetamine-dextroamphetamine (ADDERALL XR) 15 MG 24 hr capsule Take 15 mg by mouth daily.   Yes [provider]  Cholecalciferol  (VITAMIN D3) 50 MCG (2000 UT) CAPS Take 1 capsule by mouth. 10/28/21  Yes [provider]  cyclobenzaprine (FLEXERIL) 10 MG tablet Take 1 tablet by mouth in the morning and at bedtime.   Yes [provider]  diazepam (VALIUM) 10 MG tablet Take 10 mg by mouth every 6 (six) hours as needed for anxiety.  09/25/13  Yes [provider]  DULoxetine (CYMBALTA) 60 MG capsule Take 1 capsule by mouth daily.   Yes [provider]  estradiol (ESTRACE) 0.1 MG/GM vaginal cream Place 1 g vaginally. 01/18/22  Yes [provider]  gabapentin (NEURONTIN) 100 MG capsule Take 1 capsule by mouth daily. 11/02/21  Yes [provider]  hydrochlorothiazide (HYDRODIURIL) 12.5 MG tablet Take 12.5 mg by mouth daily. 01/17/14  Yes [provider]  metFORMIN (GLUCOPHAGE-XR) 750 MG 24 hr tablet Take 1 tablet by mouth daily.   Yes [provider]  methocarbamol (ROBAXIN) 500 MG tablet Take 1 tablet (500 mg total) by mouth every 6 (six) hours as needed for muscle spasms. 10/17/17  Yes Marin Olp, PA-C  omeprazole (PRILOSEC) 20 MG capsule Take 20 mg by mouth daily as needed.   Yes [provider]  oxyCODONE (ROXICODONE) 5 MG immediate release tablet Take 1 tablet (5 mg total) by mouth every  6 (six) hours as needed for severe pain. 06/05/21  Yes Henderly, Britni A, PA-C  polyethylene glycol powder (GLYCOLAX/MIRALAX) 17 GM/SCOOP powder MIX ONE CAPFUL IN 8 OZ OF WATER BY MOUTH DAILY FOR CONSTIPATION 04/30/19  Yes [provider]  rosuvastatin (CRESTOR) 20 MG tablet Take 1 tablet by mouth. 10/28/21  Yes [provider]  SUMAtriptan (IMITREX) 25 MG tablet Take 1 tablet by mouth.   Yes [provider]  acetaminophen (TYLENOL) 500 MG tablet Take 1,000 mg by mouth every 6 (six) hours as needed for moderate pain.    [provider]  Aspirin 81 MG CAPS Take 1 tablet by mouth.    [provider]  busPIRone (BUSPAR) 10 MG tablet  Take 1 tablet by mouth daily.    [provider]  diazepam (VALIUM) 10 MG tablet Take 1 tablet by mouth 3 (three) times daily.    [provider]  lidocaine (LIDODERM) 5 % 1 patch.    [provider]  Magnesium 250 MG TABS Take 1 tablet by mouth daily.    [provider]  metoprolol (LOPRESSOR) 100 MG tablet Take 100 mg by mouth 2 (two) times daily. 12/21/13   [provider]  mirabegron ER (MYRBETRIQ) 25 MG TB24 tablet Take 1 tablet by mouth daily. 12/20/21   [provider]  omeprazole (PRILOSEC) 20 MG capsule Take 1 capsule by mouth daily.    [provider]  [facility] ULTRA test strip USE 1 STRIP TO [place] DAILY 01/11/22   [provider]  potassium chloride SA (KLOR-CON M) 20 MEQ tablet Take 1 tablet (20 mEq total) by mouth 2 (two) times daily for 7 days. 06/05/21 06/12/21  Henderly, Britni A, PA-C    Allergies as of 03/13/2022 - Review Complete 03/13/2022  Allergen Reaction Noted   Amoxicillin-pot clavulanate Other (See Comments) 11/19/2018   Diclofenac  03/13/2022   Estradiol  03/13/2022   Lisinopril  03/13/2022    Family History  Problem Relation Age of Onset   Hyperlipidemia Mother    Hypertension Mother    Multiple myeloma Mother    Hyperlipidemia Father    Hypertension Father    COPD Father    Hypertension Maternal Aunt    Hypertension Maternal Uncle    Hypertension Paternal Aunt    Hypertension Paternal Uncle    Aneurysm Paternal Grandmother     Social History   Socioeconomic History   Marital status: Married    Spouse name: Not on file   Number of children: Not on file   Years of education: Not on file   Highest education level: Not on file  Occupational History   Not on file  Tobacco Use   Smoking status: Never   Smokeless tobacco: Never  Vaping Use   Vaping Use: Never used  Substance and Sexual Activity   Alcohol use: Yes    Alcohol/week: 2.0 standard drinks of alcohol     Types: 2 Glasses of wine per week    Comment: occasional   Drug use: No   Sexual activity: Not on file  Other Topics Concern   Not on file  Social History Narrative   Not on file   Social Determinants of Health   Financial Resource Strain: Not on file  Food Insecurity: Not on file  Transportation Needs: Not on file  Physical Activity: Not on file  Stress: Not on file  Social Connections: Not on file  Intimate Partner Violence: Not on file  Review of Systems: See HPI, otherwise negative ROS  Physical Exam: BP (!) 144/99   Pulse (!) 105   Temp (!) 96.8 F (36 C) (Temporal)   Resp 18   Ht '5\' 5"'  (1.651 m)   Wt 84.8 kg   SpO2 99%   BMI 31.12 kg/m  General:   Alert,  pleasant and cooperative in NAD Head:  Normocephalic and atraumatic. Neck:  Supple; no masses or thyromegaly. Lungs:  Clear throughout to auscultation, normal respiratory effort.    Heart:  +S1, +S2, Regular rate and rhythm, No edema. Abdomen:  Soft, nontender and nondistended. Normal bowel sounds, without guarding, and without rebound.   Neurologic:  Alert and  oriented x4;  grossly normal neurologically.  Impression/Plan: Christina Decker is here for an colonoscopy to be performed for  rectal bleeding   Risks, benefits, limitations, and alternatives regarding  colonoscopy have been reviewed with the patient.  Questions have been answered.  All parties agreeable.   Jonathon Bellows, MD  03/29/2022, 7:56 AM

## 2022-03-29 NOTE — Anesthesia Procedure Notes (Signed)
Procedure Name: MAC Date/Time: 03/29/2022 8:00 AM  Performed by: Jerrye Noble, CRNAPre-anesthesia Checklist: Patient identified, Emergency Drugs available, Suction available and Patient being monitored Patient Re-evaluated:Patient Re-evaluated prior to induction Oxygen Delivery Method: Nasal cannula

## 2022-03-29 NOTE — Transfer of Care (Signed)
Immediate Anesthesia Transfer of Care Note  Patient: Christina Decker  Procedure(s) Performed: COLONOSCOPY WITH PROPOFOL  Patient Location: PACU and Endoscopy Unit  Anesthesia Type:General  Level of Consciousness: drowsy and patient cooperative  Airway & Oxygen Therapy: Patient Spontanous Breathing  Post-op Assessment: Report given to RN and Post -op Vital signs reviewed and stable  Post vital signs: Reviewed and stable  Last Vitals:  Vitals Value Taken Time  BP 125/73   Temp    Pulse 82 03/29/22 0822  Resp 19 03/29/22 0822  SpO2 99 % 03/29/22 0822  Vitals shown include unvalidated device data.  Last Pain:  Vitals:   03/29/22 0655  TempSrc: Temporal         Complications: No notable events documented.

## 2022-03-30 ENCOUNTER — Encounter: Payer: Self-pay | Admitting: Gastroenterology

## 2022-03-30 LAB — SURGICAL PATHOLOGY

## 2022-04-02 ENCOUNTER — Encounter: Payer: Self-pay | Admitting: Gastroenterology

## 2022-04-16 ENCOUNTER — Encounter (INDEPENDENT_AMBULATORY_CARE_PROVIDER_SITE_OTHER): Payer: Self-pay

## 2022-04-24 ENCOUNTER — Other Ambulatory Visit: Payer: Self-pay | Admitting: Internal Medicine

## 2022-04-24 DIAGNOSIS — R0602 Shortness of breath: Secondary | ICD-10-CM

## 2022-04-24 DIAGNOSIS — R079 Chest pain, unspecified: Secondary | ICD-10-CM

## 2022-04-26 ENCOUNTER — Ambulatory Visit: Payer: Medicare Other | Admitting: Gastroenterology

## 2022-04-26 ENCOUNTER — Encounter: Payer: Self-pay | Admitting: Gastroenterology

## 2022-04-26 VITALS — BP 100/63 | HR 81 | Temp 98.9°F | Wt 194.2 lb

## 2022-04-26 DIAGNOSIS — Z8719 Personal history of other diseases of the digestive system: Secondary | ICD-10-CM | POA: Diagnosis not present

## 2022-04-26 DIAGNOSIS — K625 Hemorrhage of anus and rectum: Secondary | ICD-10-CM | POA: Diagnosis not present

## 2022-04-26 NOTE — Patient Instructions (Signed)
Nonsurgical Procedures for Hemorrhoids  Nonsurgical procedures can be used to treat hemorrhoids. Hemorrhoids are swollen veins that are inside the rectum (internal hemorrhoids) or around the anus (external hemorrhoids). They are caused by increased pressure in the anal area. This pressure may result from straining to have a bowel movement (constipation), diarrhea, pregnancy, obesity, anal sex, or sitting for long periods of time. Hemorrhoids can cause symptoms such as pain and bleeding. Various procedures may be done if diet changes, lifestyle changes, and other home treatments do not help your symptoms. Some of these procedures do not involve surgery. Tell a health care provider about: Any allergies you have. All medicines you are taking, including vitamins, herbs, eye drops, creams, and over-the-counter medicines. Any problems you or family members have had with anesthetic medicines. Any blood disorders you have. Any surgeries you have had. Any medical conditions you have. Whether you are pregnant or may be pregnant. What are the risks? Generally, this is a safe procedure. However, problems may occur, including: Infection. Bleeding. Pain. What happens before the procedure? Ask your health care provider about: Changing or stopping your regular medicines. This is especially important if you are taking diabetes medicines or blood thinners. Taking medicines such as aspirin and ibuprofen. These medicines can thin your blood. Do not take these medicines unless your health care provider tells you to take them. Taking over-the-counter medicines, vitamins, herbs, and supplements. Follow instructions from your health care provider about eating or drinking restrictions. You may need to have a procedure to examine the inside of your colon with a scope (colonoscopy). Your health care provider may do this to make sure that there are no other causes for your bleeding or pain. What happens during the  procedure?  Your health care provider will clean your rectal area with a rinsing solution. A lubricating jelly may be placed into your rectum. The jelly may contain a medicine to numb the area (local anesthetic). Your health care provider will insert a short scope (anoscope) into your rectum to examine the hemorrhoids. One of the following techniques will be used: Rubber band ligation. Your health care provider will place medical instruments through the scope to put rubber bands around the base of your hemorrhoids. The bands will cut off the blood supply to the hemorrhoids. The hemorrhoids will fall off after several days. Sclerotherapy. Your health care provider will inject medicine through the scope into your hemorrhoids. This will cause them to shrink and dry up. Infrared coagulation. Your health care provider will shine a type of light through the scope onto your hemorrhoids. This light will generate energy (infrared radiation). It will cause the hemorrhoids to scar and then fall off. Each of these procedures may vary among health care providers and hospitals. What happens after the procedure? You will be monitored to make sure that you have no bleeding. Return to your normal activities as told by your health care provider. Summary Hemorrhoids are swollen veins that are inside the rectum (internal hemorrhoids) or around the anus (external hemorrhoids). Nonsurgical procedures can be used to treat hemorrhoids. Rubber band ligation, sclerotherapy, or infrared coagulation may be used if dietary and lifestyle changes do not cause your hemorrhoids to go away. Before the procedure, ask your health care provider about changing or stopping your regular medicines. This information is not intended to replace advice given to you by your health care provider. Make sure you discuss any questions you have with your health care provider. Document Revised: 12/13/2020 Document Reviewed: 12/14/2020  Elsevier  Patient Education  [place].

## 2022-04-26 NOTE — Progress Notes (Signed)
Jonathon Bellows MD, MRCP(U.K) [address]  [place]  [place], [place] [address]  Main: [phone]  Fax: [phone]   Primary Care Physician: Danna Hefty, DO  Primary Gastroenterologist:  Dr. Jonathon Bellows   Chief Complaint  Patient presents with   Rectal Bleeding    HPI: Christina Decker is a 51 y.o. female   Summary of history :  Initially referred and seen on 03/13/2022 for hemorrhoids. she has had a colonoscopy many years back was found to have hemorrhoids underwent surgery for the same and subsequently had further bleeding and has undergone some form of banding cannot recollect on the rounds of treatment or how many bands were placed.  If he continues to have prolapsing hemorrhoids based on her history bleeding on the tissue paper toilet bowl.  Always associated with a bowel movement states she fluctuates between constipation and diarrhea of late more diarrhea after she was started on metformin.   Interval history   03/13/2022-  04/26/2022  03/29/2022: Small hemorrhoids on colonoscopy    Since the colonoscopy she has on and off a bit of rectal bleeding prolapsing of the hemorrhoids.  No other complaints   Current Outpatient Medications  Medication Sig Dispense Refill   acetaminophen (TYLENOL) 500 MG tablet Take 1,000 mg by mouth every 6 (six) hours as needed for moderate pain.     amLODipine (NORVASC) 5 MG tablet Take 1 tablet by mouth daily.     amphetamine-dextroamphetamine (ADDERALL XR) 15 MG 24 hr capsule Take 15 mg by mouth daily.     Aspirin 81 MG CAPS Take 1 tablet by mouth.     busPIRone (BUSPAR) 10 MG tablet Take 1 tablet by mouth daily.     Cholecalciferol (VITAMIN D3) 50 MCG (2000 UT) CAPS Take 1 capsule by mouth.     cyclobenzaprine (FLEXERIL) 10 MG tablet Take 1 tablet by mouth in the morning and at bedtime.     diazepam (VALIUM) 10 MG tablet Take 10 mg by mouth every 6 (six) hours as needed for anxiety.      diazepam (VALIUM) 10 MG tablet Take 1  tablet by mouth 3 (three) times daily.     DULoxetine (CYMBALTA) 60 MG capsule Take 1 capsule by mouth daily.     estradiol (ESTRACE) 0.1 MG/GM vaginal cream Place 1 g vaginally.     gabapentin (NEURONTIN) 100 MG capsule Take 1 capsule by mouth daily.     hydrochlorothiazide (HYDRODIURIL) 12.5 MG tablet Take 12.5 mg by mouth daily.     lidocaine (LIDODERM) 5 % 1 patch.     Magnesium 250 MG TABS Take 1 tablet by mouth daily.     metFORMIN (GLUCOPHAGE-XR) 750 MG 24 hr tablet Take 1 tablet by mouth daily.     methocarbamol (ROBAXIN) 500 MG tablet Take 1 tablet (500 mg total) by mouth every 6 (six) hours as needed for muscle spasms. 90 tablet 0   metoprolol (LOPRESSOR) 100 MG tablet Take 100 mg by mouth 2 (two) times daily.     mirabegron ER (MYRBETRIQ) 25 MG TB24 tablet Take 1 tablet by mouth daily.     omeprazole (PRILOSEC) 20 MG capsule Take 20 mg by mouth daily as needed.     omeprazole (PRILOSEC) 20 MG capsule Take 1 capsule by mouth daily.     ONETOUCH ULTRA test strip USE 1 STRIP TO CHECK GLUCOSE ONCE DAILY     oxyCODONE (ROXICODONE) 5 MG immediate release tablet Take 1 tablet (5 mg  total) by mouth every 6 (six) hours as needed for severe pain. 15 tablet 0   polyethylene glycol powder (GLYCOLAX/MIRALAX) 17 GM/SCOOP powder MIX ONE CAPFUL IN 8 OZ OF WATER BY MOUTH DAILY FOR CONSTIPATION     rosuvastatin (CRESTOR) 20 MG tablet Take 1 tablet by mouth.     SUMAtriptan (IMITREX) 25 MG tablet Take 1 tablet by mouth.     No current facility-administered medications for this visit.    Allergies as of 04/26/2022 - Review Complete 04/26/2022  Allergen Reaction Noted   Amoxicillin-pot clavulanate Other (See Comments) 11/19/2018   Diclofenac  03/13/2022   Estradiol  03/13/2022   Lisinopril  03/13/2022    ROS:  General: Negative for anorexia, weight loss, fever, chills, fatigue, weakness. ENT: Negative for hoarseness, difficulty swallowing , nasal congestion. CV: Negative for chest pain,  angina, palpitations, dyspnea on exertion, peripheral edema.  Respiratory: Negative for dyspnea at rest, dyspnea on exertion, cough, sputum, wheezing.  GI: See history of present illness. GU:  Negative for dysuria, hematuria, urinary incontinence, urinary frequency, nocturnal urination.  Endo: Negative for unusual weight change.    Physical Examination:   BP 100/63   Pulse 81   Temp 98.9 F (37.2 C) (Oral)   Wt 194 lb 3.2 oz (88.1 kg)   BMI 32.32 kg/m   General: Well-nourished, well-developed in no acute distress.  Eyes: No icterus. Conjunctivae pink. Neuro: Alert and oriented x 3.  Grossly intact. Skin: Warm and dry, no jaundice.   Psych: Alert and cooperative, normal mood and affect.   Imaging Studies: No results found.  Assessment and Plan:   Christina Decker is a 51 y.o. y/o female here to follow up for rectal bleeding due to hemorroids.  Discussed about hemorrhoidal banding she would like to think about it patient information provided in the interim continue high-fiber diet return to the office as needed    Dr Jonathon Bellows  MD,MRCP [facility]) Follow up in as needed

## 2022-05-03 ENCOUNTER — Encounter (HOSPITAL_COMMUNITY): Payer: Self-pay

## 2022-05-03 ENCOUNTER — Telehealth (HOSPITAL_COMMUNITY): Payer: Self-pay | Admitting: Emergency Medicine

## 2022-05-03 DIAGNOSIS — R079 Chest pain, unspecified: Secondary | ICD-10-CM

## 2022-05-03 MED ORDER — IVABRADINE HCL 5 MG PO TABS: 15.0000 mg | ORAL_TABLET | Freq: Once | ORAL | 0 refills | Status: AC

## 2022-05-03 NOTE — Telephone Encounter (Signed)
Reaching out to patient to offer assistance regarding upcoming cardiac imaging study; pt verbalizes understanding of appt date/time, parking situation and where to check in, pre-test NPO status and medications ordered, and verified current allergies; name and call back number provided for further questions should they arise Marchia Bond RN Navigator Cardiac Imaging [facility] [phone] office [phone] cell  '15mg'$  ivabradine sent to [facility] [place]

## 2022-05-07 ENCOUNTER — Ambulatory Visit
Admission: RE | Admit: 2022-05-07 | Discharge: 2022-05-07 | Disposition: A | Discharge status: Home / Self Care | Payer: Medicare Other | Source: Ambulatory Visit | Attending: Internal Medicine | Admitting: Internal Medicine

## 2022-05-07 DIAGNOSIS — R079 Chest pain, unspecified: Secondary | ICD-10-CM | POA: Diagnosis present

## 2022-05-07 DIAGNOSIS — R0602 Shortness of breath: Secondary | ICD-10-CM | POA: Diagnosis present

## 2022-05-07 MED ORDER — NITROGLYCERIN 0.4 MG SL SUBL
0.8000 mg | SUBLINGUAL_TABLET | Freq: Once | SUBLINGUAL | Status: AC
  Administered 2022-05-07: 0.8 mg via SUBLINGUAL

## 2022-05-07 MED ORDER — IOHEXOL 350 MG/ML SOLN
100.0000 mL | Freq: Once | INTRAVENOUS | Status: AC | PRN
  Administered 2022-05-07: 100 mL via INTRAVENOUS

## 2022-05-07 NOTE — Progress Notes (Signed)
Patient tolerated procedure well. Ambulate w/o difficulty. Denies any lightheadedness or being dizzy. Pt denies any pain at this time. Sitting in chair, pt is encouraged to drink additional water throughout the day and reason explained to patient. Patient verbalized understanding and all questions answered. ABC intact. No further needs at this time. Discharge from procedure area w/o issues.  

## 2022-07-10 ENCOUNTER — Ambulatory Visit (INDEPENDENT_AMBULATORY_CARE_PROVIDER_SITE_OTHER): Payer: Medicare Other

## 2022-07-10 ENCOUNTER — Ambulatory Visit: Payer: Medicare Other | Admitting: Orthopaedic Surgery

## 2022-07-10 ENCOUNTER — Encounter: Payer: Self-pay | Admitting: Orthopaedic Surgery

## 2022-07-10 VITALS — BP 114/75 | HR 90 | Ht 65.5 in | Wt 197.0 lb

## 2022-07-10 DIAGNOSIS — M79604 Pain in right leg: Secondary | ICD-10-CM | POA: Diagnosis not present

## 2022-07-10 DIAGNOSIS — M5416 Radiculopathy, lumbar region: Secondary | ICD-10-CM

## 2022-07-10 DIAGNOSIS — M545 Low back pain, unspecified: Secondary | ICD-10-CM

## 2022-07-10 DIAGNOSIS — M79605 Pain in left leg: Secondary | ICD-10-CM

## 2022-07-10 DIAGNOSIS — G8929 Other chronic pain: Secondary | ICD-10-CM | POA: Diagnosis not present

## 2022-07-10 MED ORDER — HYDROCODONE-ACETAMINOPHEN 5-325 MG PO TABS: ORAL_TABLET | ORAL | 0 refills | Status: DC

## 2022-07-10 MED ORDER — NAPROXEN 500 MG PO TABS: 500.0000 mg | ORAL_TABLET | Freq: Two times a day (BID) | ORAL | 5 refills | Status: DC

## 2022-07-10 NOTE — Progress Notes (Signed)
Subjective:    Patient ID: Christina Decker, female    DOB: [birth year], 52 y.o.   MRN: [id number]  HPI She has a long history of lower back pain.  In 2019 she had MRI of lower back.  She has developed more pain in the lower back, more on the left side, over the last two to three months.  She has no trauma. She has tried ice, heat, Tylenol and Aleve with only slight help.  She is using a walker now.  She has seen her primary care Dr. Tarry Kos on 06-26-22 and I hove reviewed the notes.  She is taking Flexeril which helps.  She is concerned that the pain just stays with her and she has no relief.   Review of Systems  Constitutional:  Positive for activity change.  Cardiovascular:  Positive for palpitations.  Endocrine: Positive for cold intolerance and heat intolerance.  Musculoskeletal:  Positive for arthralgias, back pain, gait problem and myalgias.  Allergic/Immunologic: Positive for environmental allergies.  All other systems reviewed and are negative. For Review of Systems, all other systems reviewed and are negative.  The following is a summary of the past history medically, past history surgically, known current medicines, social history and family history.  This information is gathered electronically by the computer from prior information and documentation.  I review this each visit and have found including this information at this point in the chart is beneficial and informative.   Past Medical History:  Diagnosis Date   Abnormal heart rhythms    Abnormal thyroid stimulating hormone level    Anxiety    Balance problem    Cervical disc disease    Depression    Diabetes mellitus without complication (HCC)    Dyslipidemia    Dyspareunia due to medical condition in female    Dysrhythmia    Environmental and seasonal allergies    Fatty liver disease, nonalcoholic    Fibrocystic breast disease    Fibromuscular hyperplasia (HCC)    Fibromuscular hyperplasia (HCC)    Fibromyalgia     Fibromyalgia    Hemorrhoid    Hx of adenomatous colonic polyps    Hyperkalemia    Hypertension    IBS (irritable bowel syndrome)    IBS (irritable bowel syndrome)    Low back pain    Lower extremity edema    Migraine    Narcolepsy    Narcolepsy and cataplexy    Pain in left arm    Pain in left shoulder    Syncope    Thyroid disease    Vitamin D deficiency     Past Surgical History:  Procedure Laterality Date   ABDOMINAL HYSTERECTOMY     ANTERIOR CERVICAL DECOMP/DISCECTOMY FUSION N/A 10/16/2017   Procedure: ANTERIOR CERVICAL DECOMPRESSION/DISCECTOMY FUSION 2 LEVELS-C5-7, C6 CORPECTOMY;  Surgeon: Meade Maw, MD;  Location: [facility] ORS;  Service: Neurosurgery;  Laterality: N/A;   COLONOSCOPY WITH PROPOFOL N/A 03/29/2022   Procedure: COLONOSCOPY WITH PROPOFOL;  Surgeon: Jonathon Bellows, MD;  Location: [facility] ENDOSCOPY;  Service: Gastroenterology;  Laterality: N/A;   HEMORRHOID SURGERY     HERNIA REPAIR      Current Outpatient Medications on File Prior to Visit  Medication Sig Dispense Refill   acetaminophen (TYLENOL) 500 MG tablet Take 1,000 mg by mouth every 6 (six) hours as needed for moderate pain.     amLODipine (NORVASC) 5 MG tablet Take 1 tablet by mouth daily.     amphetamine-dextroamphetamine (ADDERALL XR) 15 MG 24 hr  capsule Take 15 mg by mouth daily.     Aspirin 81 MG CAPS Take 1 tablet by mouth.     busPIRone (BUSPAR) 10 MG tablet Take 1 tablet by mouth daily.     chlorthalidone (HYGROTON) 25 MG tablet 1 tablet in the morning with food Orally Once a day     Cholecalciferol (VITAMIN D3) 50 MCG (2000 UT) CAPS Take 1 capsule by mouth.     clonazePAM (KLONOPIN) 1 MG tablet 1 tablet Orally Once a day     cyclobenzaprine (FLEXERIL) 10 MG tablet Take 1 tablet by mouth in the morning and at bedtime.     diazepam (VALIUM) 10 MG tablet Take 10 mg by mouth every 6 (six) hours as needed for anxiety.      DULoxetine (CYMBALTA) 60 MG capsule Take 1 capsule by mouth daily.      Magnesium 250 MG TABS Take 1 tablet by mouth daily.     metFORMIN (GLUCOPHAGE-XR) 750 MG 24 hr tablet Take 1 tablet by mouth daily.     metoprolol (LOPRESSOR) 100 MG tablet Take 100 mg by mouth 2 (two) times daily.     mirabegron ER (MYRBETRIQ) 25 MG TB24 tablet Take 1 tablet by mouth daily.     omeprazole (PRILOSEC) 20 MG capsule Take 20 mg by mouth daily as needed.     omeprazole (PRILOSEC) 20 MG capsule Take 1 capsule by mouth daily.     ONETOUCH ULTRA test strip USE 1 STRIP TO CHECK GLUCOSE ONCE DAILY     oxyCODONE (ROXICODONE) 5 MG immediate release tablet Take 1 tablet (5 mg total) by mouth every 6 (six) hours as needed for severe pain. 15 tablet 0   polyethylene glycol powder (GLYCOLAX/MIRALAX) 17 GM/SCOOP powder MIX ONE CAPFUL IN 8 OZ OF WATER BY MOUTH DAILY FOR CONSTIPATION     rosuvastatin (CRESTOR) 20 MG tablet Take 1 tablet by mouth.     SUMAtriptan (IMITREX) 25 MG tablet Take 1 tablet by mouth.     No current facility-administered medications on file prior to visit.    Social History   Socioeconomic History   Marital status: Married    Spouse name: Not on file   Number of children: Not on file   Years of education: Not on file   Highest education level: Not on file  Occupational History   Not on file  Tobacco Use   Smoking status: Never   Smokeless tobacco: Never  Vaping Use   Vaping Use: Never used  Substance and Sexual Activity   Alcohol use: Yes    Alcohol/week: 2.0 standard drinks of alcohol    Types: 2 Glasses of wine per week    Comment: occasional   Drug use: No   Sexual activity: Not on file  Other Topics Concern   Not on file  Social History Narrative   Not on file   Social Determinants of Health   Financial Resource Strain: Not on file  Food Insecurity: Not on file  Transportation Needs: Not on file  Physical Activity: Not on file  Stress: Not on file  Social Connections: Not on file  Intimate Partner Violence: Not on file    Family History   Problem Relation Age of Onset   Hyperlipidemia Mother    Hypertension Mother    Multiple myeloma Mother    Hyperlipidemia Father    Hypertension Father    COPD Father    Hypertension Maternal Aunt    Hypertension Maternal Uncle  Hypertension Paternal Aunt    Hypertension Paternal Uncle    Aneurysm Paternal Grandmother     BP 114/75   Pulse 90   Ht 5' 5.5" (1.664 m)   Wt 197 lb (89.4 kg)   BMI 32.28 kg/m   Body mass index is 32.28 kg/m.      Objective:   Physical Exam Vitals and nursing note reviewed. Exam conducted with a chaperone present.  Constitutional:      Appearance: She is well-developed.  HENT:     Head: Normocephalic and atraumatic.  Eyes:     Conjunctiva/sclera: Conjunctivae normal.     Pupils: Pupils are equal, round, and reactive to light.  Cardiovascular:     Rate and Rhythm: Normal rate and regular rhythm.  Pulmonary:     Effort: Pulmonary effort is normal.  Abdominal:     Palpations: Abdomen is soft.  Musculoskeletal:       Arms:     Cervical back: Normal range of motion and neck supple.  Skin:    General: Skin is warm and dry.  Neurological:     Mental Status: She is alert and oriented to person, place, and time.     Cranial Nerves: No cranial nerve deficit.     Motor: No abnormal muscle tone.     Coordination: Coordination normal.     Deep Tendon Reflexes: Reflexes are normal and symmetric. Reflexes normal.  Psychiatric:        Behavior: Behavior normal.        Thought Content: Thought content normal.        Judgment: Judgment normal.   X-rays were done of the lumbar spine, reported separately.        Assessment & Plan:   Encounter Diagnoses  Name Primary?   Chronic radicular lumbar pain Yes   Lumbar pain with radiation down both legs    I have reviewed the [place] web site prior to prescribing narcotic medicine for this patient.  I will also give naprosyn 500 po bid  pc.  Return in two weeks.  Exercises for back given.  Get MRI of the lumbar spine.  Call if any problem.  Precautions discussed.  Electronically Signed Sanjuana Kava, MD 1/23/20242:40 PM

## 2022-07-10 NOTE — Patient Instructions (Addendum)
Central Scheduling [phone]  While we are working on your approval please go ahead and call to schedule your appointment to be done within one week. AFTER you have made your imaging appointment, please call our office back at [phone] to schedule an appointment to review your results.   You have been prescribed Naproxen '500mg'$  by mouth twice daily. You must take this medication WITH food. If you only have coffee for breakfast that isn't a meal. Wait until you eat. It will take about 10 days for this to fully get into your system. CONTINUE THE FLEXERIL.  YOU HAVE BEEN PRESCRIBED A NARCOTIC/PAIN MEDICATION. DO NOT DRIVE OR OPERATE HEAVY MACHINERY WHILE USING THIS MEDICATION. PAIN MEDICATION ALSO CAUSES CONSTIPATION. YOU CAN USE A STOOL SOFTENER OR MIRALAX TO HELP WITH THIS.   YOU HAVE BEEN PROVIDED A LIST OF HOME EXERCISES FOR YOUR BACK. DO THESE ONCE DAILY.  Dr.Keeling is here all day on Tuesdays. He is here half a day on Wednesday mornings, and Thursday mornings. If you need anything such as a medication refill, please either call BEFORE the end of the day on [facility] or send a message through [place]. Your pharmacy can send a refill request for you. Calling by the end of the day on [facility] allows [place] time to send Dr.Keeling the request and for him to respond before he leaves on Thursdays.   My name is Abby and I assist [place]. If you need anything before your next appointment, please do not hesitate to call the office at [phone] and ask to leave a message for me. I will respond within 24-48 business hours.

## 2022-07-24 ENCOUNTER — Ambulatory Visit: Payer: Medicare Other | Admitting: Orthopaedic Surgery

## 2022-08-06 ENCOUNTER — Telehealth: Payer: Self-pay | Admitting: Orthopaedic Surgery

## 2022-08-06 NOTE — Telephone Encounter (Signed)
Returned patients call. No answer. Unable to leave a vm due to vm being full.

## 2022-08-06 NOTE — Telephone Encounter (Signed)
The patient lvm stating she would like Dr. Brooke Bonito assistant to call her.  [phone]

## 2022-08-08 ENCOUNTER — Other Ambulatory Visit: Payer: Self-pay

## 2022-08-08 ENCOUNTER — Emergency Department (HOSPITAL_COMMUNITY)
Admission: EM | Admit: 2022-08-08 | Discharge: 2022-08-08 | Disposition: A | Discharge status: Home / Self Care | Payer: Medicare Other | Attending: Emergency Medicine | Admitting: Emergency Medicine

## 2022-08-08 ENCOUNTER — Encounter (HOSPITAL_COMMUNITY): Payer: Self-pay | Admitting: *Deleted

## 2022-08-08 ENCOUNTER — Telehealth: Payer: Self-pay | Admitting: Orthopaedic Surgery

## 2022-08-08 DIAGNOSIS — M545 Low back pain, unspecified: Secondary | ICD-10-CM

## 2022-08-08 DIAGNOSIS — G8929 Other chronic pain: Secondary | ICD-10-CM | POA: Insufficient documentation

## 2022-08-08 NOTE — ED Triage Notes (Signed)
Pt with lower back pain and pt with difficultly with walking and today with incont of bowels x 1 today.  Denies any tingling,  + weakness.  C/o neck pain with turning at times.

## 2022-08-08 NOTE — Discharge Instructions (Signed)
Your exam today is reassuring as you do not have any neurologic deficits which would suggest needing emergent imaging or surgery.  Plan to keep your appointment as arranged by Dr. Luna Glasgow.

## 2022-08-08 NOTE — Telephone Encounter (Signed)
Spoke with patient and advised her that she does need to go to the ED and be seen. It is considered an emergency. She verbalized understanding. I attempted to call this patient earlier with no answer and I was unable to leave a vm.

## 2022-08-08 NOTE — ED Provider Notes (Incomplete)
[facility] Provider Note   CSN: [id number] Arrival date & time: 08/08/22  1706     History {Add pertinent medical, surgical, social history, OB history to HPI:1} Chief Complaint  Patient presents with  . Back Pain    Christina Decker is a 52 y.o. female.  The history is provided by the patient.       Home Medications Prior to Admission medications   Medication Sig Start Date End Date Taking? Authorizing Provider  acetaminophen (TYLENOL) 500 MG tablet Take 1,000 mg by mouth every 6 (six) hours as needed for moderate pain.    [provider]  amLODipine (NORVASC) 5 MG tablet Take 1 tablet by mouth daily.    [provider]  amphetamine-dextroamphetamine (ADDERALL XR) 15 MG 24 hr capsule Take 15 mg by mouth daily.    [provider]  Aspirin 81 MG CAPS Take 1 tablet by mouth.    [provider]  busPIRone (BUSPAR) 10 MG tablet Take 1 tablet by mouth daily.    [provider]  chlorthalidone (HYGROTON) 25 MG tablet 1 tablet in the morning with food Orally Once a day    [provider]  Cholecalciferol (VITAMIN D3) 50 MCG (2000 UT) CAPS Take 1 capsule by mouth. 10/28/21   [provider]  clonazePAM (KLONOPIN) 1 MG tablet 1 tablet Orally Once a day    [provider]  cyclobenzaprine (FLEXERIL) 10 MG tablet Take 1 tablet by mouth in the morning and at bedtime.    [provider]  diazepam (VALIUM) 10 MG tablet Take 10 mg by mouth every 6 (six) hours as needed for anxiety.  09/25/13   [provider]  DULoxetine (CYMBALTA) 60 MG capsule Take 1 capsule by mouth daily.    [provider]  HYDROcodone-acetaminophen (NORCO/VICODIN) 5-325 MG tablet One tablet every four hours for pain. 07/10/22   Sanjuana Kava, MD  Magnesium 250 MG TABS Take 1 tablet by mouth daily.    [provider]  metFORMIN (GLUCOPHAGE-XR) 750 MG 24 hr tablet Take 1  tablet by mouth daily.    [provider]  metoprolol (LOPRESSOR) 100 MG tablet Take 100 mg by mouth 2 (two) times daily. 12/21/13   [provider]  mirabegron ER (MYRBETRIQ) 25 MG TB24 tablet Take 1 tablet by mouth daily. 12/20/21   [provider]  naproxen (NAPROSYN) 500 MG tablet Take 1 tablet (500 mg total) by mouth 2 (two) times daily with a meal. 07/10/22   Sanjuana Kava, MD  omeprazole (PRILOSEC) 20 MG capsule Take 20 mg by mouth daily as needed.    [provider]  omeprazole (PRILOSEC) 20 MG capsule Take 1 capsule by mouth daily.    [provider]  [facility] ULTRA test strip USE 1 STRIP TO [place] DAILY 01/11/22   [provider]  oxyCODONE (ROXICODONE) 5 MG immediate release tablet Take 1 tablet (5 mg total) by mouth every 6 (six) hours as needed for severe pain. 06/05/21   Henderly, Britni A, PA-C  polyethylene glycol powder (GLYCOLAX/MIRALAX) 17 GM/SCOOP powder MIX ONE CAPFUL IN 8 OZ OF WATER BY MOUTH DAILY FOR CONSTIPATION 04/30/19   [provider]  rosuvastatin (CRESTOR) 20 MG tablet Take 1 tablet by mouth. 10/28/21   [provider]  SUMAtriptan (IMITREX) 25 MG tablet Take 1 tablet by mouth.    [provider]      Allergies    Amoxicillin-pot  clavulanate, Diclofenac, Estradiol, and Lisinopril    Review of Systems   Review of Systems  Physical Exam Updated Vital Signs BP 126/81   Pulse 75   Temp 98.5 F (36.9 C) (Oral)   Resp 18   SpO2 100%  Physical Exam  ED Results / Procedures / Treatments   Labs (all labs ordered are listed, but only abnormal results are displayed) Labs Reviewed - No data to display  EKG None  Radiology No results found.  Procedures Procedures  {Document cardiac monitor, telemetry assessment procedure when appropriate:1}  Medications Ordered in ED Medications - No data to display  ED Course/ Medical Decision Making/ A&P   {   Click here for  ABCD2, HEART and other calculatorsREFRESH Note before signing :1}                          Medical Decision Making  ***  {Document critical care time when appropriate:1} {Document review of labs and clinical decision tools ie heart score, Chads2Vasc2 etc:1}  {Document your independent review of radiology images, and any outside records:1} {Document your discussion with family members, caretakers, and with consultants:1} {Document social determinants of health affecting pt's care:1} {Document your decision making why or why not admission, treatments were needed:1} Final Clinical Impression(s) / ED Diagnoses Final diagnoses:  None    Rx / DC Orders ED Discharge Orders     None

## 2022-08-08 NOTE — Telephone Encounter (Signed)
Patient called, stated that Dr. Luna Glasgow told her if she had any "bathroom" issues she should go to the ED.  She stated that today she was in the tub and when she got out she then realized that she had a bowel movement on herself.  She wants to know if she needs to go to the ED.  Please call the patient back [phone]

## 2022-08-08 NOTE — ED Provider Notes (Signed)
[facility] EMERGENCY DEPARTMENT AT [facility] Provider Note   CSN: [id number] Arrival date & time: 08/08/22  1706     History  Chief Complaint  Patient presents with   Back Pain    Christina Decker is a 52 y.o. female with a history of hypertension, fibromyalgia, fibromuscular dysplasia, cervical myelopathy chronic low back pain under the care of Dr. Hilda Lias and was recently diagnosed with lumbar spondylolisthesis currently awaiting MRI imaging.  She uses a walker at baseline, and has been having escalation in her low back pain and today reports she had an episode of incontinence of stool, raising concern for possible worsening of her lumbar disease.  She describes bending over to clean her tub after taking a bath and was incontinent of stool during this activity.  She denies any increased weakness or numbness in her legs, she has had no urinary incontinence, she states this was the formed stool, not diarrhea.  The history is provided by the patient.       Home Medications Prior to Admission medications   Medication Sig Start Date End Date Taking? Authorizing Provider  acetaminophen (TYLENOL) 500 MG tablet Take 1,000 mg by mouth every 6 (six) hours as needed for moderate pain.    [provider]  amLODipine (NORVASC) 5 MG tablet Take 1 tablet by mouth daily.    [provider]  amphetamine-dextroamphetamine (ADDERALL XR) 15 MG 24 hr capsule Take 15 mg by mouth daily.    [provider]  Aspirin 81 MG CAPS Take 1 tablet by mouth.    [provider]  busPIRone (BUSPAR) 10 MG tablet Take 1 tablet by mouth daily.    [provider]  chlorthalidone (HYGROTON) 25 MG tablet 1 tablet in the morning with food Orally Once a day    [provider]  Cholecalciferol (VITAMIN D3) 50 MCG (2000 UT) CAPS Take 1 capsule by mouth. 10/28/21   [provider]  clonazePAM (KLONOPIN) 1 MG tablet 1 tablet Orally Once a day    [provider]  cyclobenzaprine (FLEXERIL) 10 MG tablet Take 1 tablet by mouth in the morning and at bedtime.    [provider]  diazepam (VALIUM) 10 MG tablet Take 10 mg by mouth every 6 (six) hours as needed for anxiety.  09/25/13   [provider]  DULoxetine (CYMBALTA) 60 MG capsule Take 1 capsule by mouth daily.    [provider]  HYDROcodone-acetaminophen (NORCO/VICODIN) 5-325 MG tablet One tablet every four hours for pain. 07/10/22   Darreld Mclean, MD  Magnesium 250 MG TABS Take 1 tablet by mouth daily.    [provider]  metFORMIN (GLUCOPHAGE-XR) 750 MG 24 hr tablet Take 1 tablet by mouth daily.    [provider]  metoprolol (LOPRESSOR) 100 MG tablet Take 100 mg by mouth 2 (two) times daily. 12/21/13   [provider]  mirabegron ER (MYRBETRIQ) 25 MG TB24 tablet Take 1 tablet by mouth daily. 12/20/21   [provider]  naproxen (NAPROSYN) 500 MG tablet Take 1 tablet (500 mg total) by mouth 2 (two) times daily with a meal. 07/10/22   Darreld Mclean, MD  omeprazole (PRILOSEC) 20 MG capsule Take 20 mg by mouth daily as needed.    [provider]  omeprazole (PRILOSEC) 20 MG capsule Take 1 capsule by mouth daily.    [provider]  [facility] ULTRA test strip USE 1 STRIP TO CHECK GLUCOSE ONCE DAILY 01/11/22  [provider]  oxyCODONE (ROXICODONE) 5 MG immediate release tablet Take 1 tablet (5 mg total) by mouth every 6 (six) hours as needed for severe pain. 06/05/21   Henderly, Britni A, PA-C  polyethylene glycol powder (GLYCOLAX/MIRALAX) 17 GM/SCOOP powder MIX ONE CAPFUL IN 8 OZ OF WATER BY MOUTH DAILY FOR CONSTIPATION 04/30/19   [provider]  rosuvastatin (CRESTOR) 20 MG tablet Take 1 tablet by mouth. 10/28/21   [provider]  SUMAtriptan (IMITREX) 25 MG tablet Take 1 tablet by mouth.    [provider]      Allergies    Amoxicillin-pot clavulanate, Diclofenac, Estradiol,  and Lisinopril    Review of Systems   Review of Systems  Constitutional:  Negative for fever.  Respiratory:  Negative for shortness of breath.   Cardiovascular:  Negative for chest pain and leg swelling.  Gastrointestinal:  Negative for abdominal distention, abdominal pain and constipation.  Genitourinary:  Negative for difficulty urinating, dysuria, flank pain, frequency and urgency.  Musculoskeletal:  Positive for back pain. Negative for gait problem and joint swelling.  Skin:  Negative for rash.  Neurological:  Negative for weakness and numbness.    Physical Exam Updated Vital Signs BP 126/81   Pulse 75   Temp 98.5 F (36.9 C) (Oral)   Resp 18   SpO2 100%  Physical Exam Vitals and nursing note reviewed. Exam conducted with a chaperone present.  Constitutional:      Appearance: She is well-developed.  HENT:     Head: Normocephalic.  Eyes:     Conjunctiva/sclera: Conjunctivae normal.  Cardiovascular:     Rate and Rhythm: Normal rate.     Pulses: Normal pulses.     Comments: Pedal pulses normal. Pulmonary:     Effort: Pulmonary effort is normal.  Abdominal:     General: Bowel sounds are normal. There is no distension.     Palpations: Abdomen is soft. There is no mass.  Genitourinary:    Comments: Normal rectal tone,  No perineal numbness. Musculoskeletal:        General: Normal range of motion.     Cervical back: Normal range of motion and neck supple.     Lumbar back: Tenderness present. No swelling, edema or spasms.  Skin:    General: Skin is warm and dry.  Neurological:     General: No focal deficit present.     Mental Status: She is alert.     Sensory: No sensory deficit.     Motor: No tremor or atrophy.     Gait: Gait normal.     Deep Tendon Reflexes:     Reflex Scores:      Patellar reflexes are 2+ on the right side and 2+ on the left side.    Comments: No strength deficit noted in hip and knee flexor and extensor muscle groups.  Ankle flexion and  extension intact.  No foot drop.     ED Results / Procedures / Treatments   Labs (all labs ordered are listed, but only abnormal results are displayed) Labs Reviewed - No data to display  EKG None  Radiology No results found.  Procedures Procedures    Medications Ordered in ED Medications - No data to display  ED Course/ Medical Decision Making/ A&P                             Medical Decision Making Patient with a history of  chronic low back pain with recent diagnosis of spondylolisthesis.  Stool incontinence x 1 prior to arrival with patient concerned that this could represent worsening lumbar disease/cauda equina.  She has a completely normal neuroexam with no loss of strength or sensation in her lower extremities, her exam does not suggest cauda equina or any emergent lumbar concerns.  Patient was given reassurance, advised follow-up with Dr. Hilda Lias and plan to proceed with her outpatient MRI which he has scheduled.           Final Clinical Impression(s) / ED Diagnoses Final diagnoses:  Chronic midline low back pain without sciatica    Rx / DC Orders ED Discharge Orders     None         Victoriano Lain 08/10/22 2158    Gloris Manchester, MD 08/13/22 [id number]

## 2022-08-08 NOTE — ED Notes (Signed)
ED PA at bedside

## 2022-08-15 ENCOUNTER — Ambulatory Visit (HOSPITAL_COMMUNITY)
Admission: RE | Admit: 2022-08-15 | Discharge: 2022-08-15 | Disposition: A | Discharge status: Home / Self Care | Payer: Medicare Other | Source: Ambulatory Visit | Attending: Orthopaedic Surgery | Admitting: Orthopaedic Surgery

## 2022-08-15 DIAGNOSIS — M5416 Radiculopathy, lumbar region: Secondary | ICD-10-CM | POA: Insufficient documentation

## 2022-08-15 DIAGNOSIS — G8929 Other chronic pain: Secondary | ICD-10-CM | POA: Insufficient documentation

## 2022-08-15 DIAGNOSIS — M79604 Pain in right leg: Secondary | ICD-10-CM | POA: Insufficient documentation

## 2022-08-15 DIAGNOSIS — M545 Low back pain, unspecified: Secondary | ICD-10-CM | POA: Diagnosis present

## 2022-08-15 DIAGNOSIS — M79605 Pain in left leg: Secondary | ICD-10-CM | POA: Insufficient documentation

## 2022-08-16 ENCOUNTER — Encounter: Payer: Self-pay | Admitting: Radiology

## 2022-08-22 ENCOUNTER — Encounter: Payer: Self-pay | Admitting: Orthopaedic Surgery

## 2022-08-22 ENCOUNTER — Ambulatory Visit: Payer: Medicare Other | Admitting: Orthopaedic Surgery

## 2022-08-22 DIAGNOSIS — M79605 Pain in left leg: Secondary | ICD-10-CM

## 2022-08-22 DIAGNOSIS — M79604 Pain in right leg: Secondary | ICD-10-CM

## 2022-08-22 DIAGNOSIS — M4726 Other spondylosis with radiculopathy, lumbar region: Secondary | ICD-10-CM

## 2022-08-22 DIAGNOSIS — G8929 Other chronic pain: Secondary | ICD-10-CM | POA: Diagnosis not present

## 2022-08-22 NOTE — Patient Instructions (Signed)
You have been referred to Dr.Fred Ernestina Patches at our sister office in [place] to discuss an epidural steroid injection. If you have not received a call from their office to schedule your consultation appointment in 3-5 business days, please call them to set that appointment up.   [facility] [place] PHONE: [phone]  **PLEASE NOTE: HIS OFFICE MAY HAVE TO GET APPROVAL FROM YOUR INSURANCE COMPANY FOR THAT FIRST VISIT. THIS MAY TAKE ADDITIONAL TIME AS MANY INSURANCES REQUIRE A REVIEW BEFORE THEY GRANT THE APPROVAL**

## 2022-08-22 NOTE — Progress Notes (Signed)
My back hurts.  She had MRI of the lumbar spine showing: IMPRESSION: Multilevel facet arthropathy, worst at L4-L5, where there is grade 1 anterolisthesis and mild spinal canal stenosis. No significant neural foraminal narrowing at any level.  I have explained the findings to her.  I will have her see Dr. Ernestina Patches for epidural.  I have independently reviewed the MRI.    Spine/Pelvis examination:  Inspection:  Overall, sacoiliac joint benign and hips nontender; without crepitus or defects.   Thoracic spine inspection: Alignment normal without kyphosis present   Lumbar spine inspection:  Alignment  with normal lumbar lordosis, without scoliosis apparent.   Thoracic spine palpation:  without tenderness of spinal processes   Lumbar spine palpation: without tenderness of lumbar area; without tightness of lumbar muscles    Range of Motion:   Lumbar flexion, forward flexion is normal without pain or tenderness    Lumbar extension is full without pain or tenderness   Left lateral bend is normal without pain or tenderness   Right lateral bend is normal without pain or tenderness   Straight leg raising is normal  Strength & tone: normal   Stability overall normal stability  Encounter Diagnoses  Name Primary?   Chronic radicular lumbar pain Yes   Lumbar pain with radiation down both legs    Other spondylosis with radiculopathy, lumbar region    To Dr. Ernestina Patches.  I will see her in six weeks.  Call if any problem.  Precautions discussed.  Electronically Signed Sanjuana Kava, MD 3/6/202410:24 AM

## 2022-09-03 ENCOUNTER — Encounter: Payer: Medicare Other | Admitting: Physical Medicine and Rehabilitation

## 2022-09-04 ENCOUNTER — Encounter: Payer: Medicare Other | Admitting: Physical Medicine and Rehabilitation

## 2022-09-06 ENCOUNTER — Ambulatory Visit: Payer: Medicare Other | Admitting: Physical Medicine and Rehabilitation

## 2022-09-06 ENCOUNTER — Other Ambulatory Visit: Payer: Self-pay

## 2022-09-06 VITALS — BP 129/86 | HR 111

## 2022-09-06 DIAGNOSIS — M5416 Radiculopathy, lumbar region: Secondary | ICD-10-CM

## 2022-09-06 MED ORDER — METHYLPREDNISOLONE ACETATE 80 MG/ML IJ SUSP
80.0000 mg | Freq: Once | INTRAMUSCULAR | Status: AC
  Administered 2022-09-06: 80 mg

## 2022-09-06 NOTE — Patient Instructions (Signed)

## 2022-09-06 NOTE — Progress Notes (Signed)
Functional Pain Scale - descriptive words and definitions  Unmanageable (7)  Pain interferes with normal ADL's/nothing seems to help/sleep is very difficult/active distractions are very difficult to concentrate on. Severe range order  Average Pain  varies   +Driver, -BT, -Dye Allergies.  Lower back pain on both sides and can radiate into either leg

## 2022-09-12 NOTE — Progress Notes (Signed)
Christina Decker - 52 y.o. female MRN [id number]  Date of birth: 29-Dec-1970  Office Visit Note: Visit Date: 09/06/2022 PCP: Danna Hefty, DO Referred by: Danna Hefty, DO  Subjective: Chief Complaint  Patient presents with   Lower Back - Pain   HPI:  Christina Decker is a 52 y.o. female who comes in today at the request of Dr. Sanjuana Kava for planned Left L4-5 Lumbar Interlaminar epidural steroid injection with fluoroscopic guidance.  The patient has failed conservative care including home exercise, medications, time and activity modification.  This injection will be diagnostic and hopefully therapeutic.  Please see requesting physician notes for further details and justification.    ROS Otherwise per HPI.  Assessment & Plan: Visit Diagnoses:    ICD-10-CM   1. Lumbar radiculopathy  M54.16 XR C-ARM NO REPORT    Epidural Steroid injection    methylPREDNISolone acetate (DEPO-MEDROL) injection 80 mg      Plan: No additional findings.   Meds & Orders:  Meds ordered this encounter  Medications   methylPREDNISolone acetate (DEPO-MEDROL) injection 80 mg    Orders Placed This Encounter  Procedures   XR C-ARM NO REPORT   Epidural Steroid injection    Follow-up: Return for visit to requesting provider as needed.   Procedures: No procedures performed  Lumbar Epidural Steroid Injection - Interlaminar Approach with Fluoroscopic Guidance  Patient: Christina Decker      Date of Birth: 08-15-1970 MRN: [id number] PCP: Danna Hefty, DO      Visit Date: 09/06/2022   Universal Protocol:     Consent Given By: the patient  Position: PRONE  Additional Comments: Vital signs were monitored before and after the procedure. Patient was prepped and draped in the usual sterile fashion. The correct patient, procedure, and site was verified.   Injection Procedure Details:   Procedure diagnoses: Lumbar radiculopathy [M54.16]   Meds Administered:  Meds ordered this encounter   Medications   methylPREDNISolone acetate (DEPO-MEDROL) injection 80 mg     Laterality: Left  Location/Site:  L4-5  Needle: 3.5 in., 20 ga. Tuohy  Needle Placement: Paramedian epidural  Findings:   -Comments: Excellent flow of contrast into the epidural space.  Procedure Details: Using a paramedian approach from the side mentioned above, the region overlying the inferior lamina was localized under fluoroscopic visualization and the soft tissues overlying this structure were infiltrated with 4 ml. of 1% Lidocaine without Epinephrine. The Tuohy needle was inserted into the epidural space using a paramedian approach.   The epidural space was localized using loss of resistance along with counter oblique bi-planar fluoroscopic views.  After negative aspirate for air, blood, and CSF, a 2 ml. volume of Isovue-250 was injected into the epidural space and the flow of contrast was observed. Radiographs were obtained for documentation purposes.    The injectate was administered into the level noted above.   Additional Comments:  No complications occurred Dressing: 2 x 2 sterile gauze and Band-Aid    Post-procedure details: Patient was observed during the procedure. Post-procedure instructions were reviewed.  Patient left the clinic in stable condition.   Clinical History: MRI LUMBAR SPINE WITHOUT CONTRAST   TECHNIQUE: Multiplanar, multisequence MR imaging of the lumbar spine was performed. No intravenous contrast was administered.   COMPARISON:  Lumbar spine radiographs 07/10/2022.   FINDINGS: Segmentation: Conventional numbering is assumed with 5 non-rib-bearing, lumbar type vertebral bodies.   Alignment:  Unchanged grade 1 anterolisthesis of L4 on L5.  Vertebrae: Benign vertebral hemangioma at L3. No suspicious marrow lesions.   Conus medullaris and cauda equina: Conus extends to the L1 level. Conus and cauda equina appear normal.   Paraspinal and other soft tissues:  Unremarkable.   Disc levels:   T12-L1:  Normal.   L1-L2: Small disc bulge and mild bilateral facet arthropathy. No spinal canal stenosis or neural foraminal narrowing.   L2-L3: No disc herniation, spinal canal stenosis or neural foraminal narrowing. Mild bilateral facet arthropathy.   L3-L4: No disc herniation, spinal canal stenosis or neural foraminal narrowing. Mild bilateral facet arthropathy.   L4-L5: Anterolisthesis with uncovered disc and moderate bilateral facet arthropathy results in mild spinal canal stenosis. No significant neural foraminal narrowing.   L5-S1: No disc herniation, spinal canal stenosis or neural foraminal narrowing. Moderate bilateral facet arthropathy.   IMPRESSION: Multilevel facet arthropathy, worst at L4-L5, where there is grade 1 anterolisthesis and mild spinal canal stenosis. No significant neural foraminal narrowing at any level.     Electronically Signed   By: Emmit Alexanders M.D.   On: 08/16/2022 11:21     Objective:  VS:  HT:    WT:   BMI:     BP:129/86  HR:(!) 111bpm  TEMP: ( )  RESP:  Physical Exam Vitals and nursing note reviewed.  Constitutional:      General: She is not in acute distress.    Appearance: Normal appearance. She is not ill-appearing.  HENT:     Head: Normocephalic and atraumatic.     Right Ear: External ear normal.     Left Ear: External ear normal.  Eyes:     Extraocular Movements: Extraocular movements intact.  Cardiovascular:     Rate and Rhythm: Normal rate.     Pulses: Normal pulses.  Pulmonary:     Effort: Pulmonary effort is normal. No respiratory distress.  Abdominal:     General: There is no distension.     Palpations: Abdomen is soft.  Musculoskeletal:        General: Tenderness present.     Cervical back: Neck supple.     Right lower leg: No edema.     Left lower leg: No edema.     Comments: Patient has good distal strength with no pain over the greater trochanters.  No clonus or focal  weakness.  Skin:    Findings: No erythema, lesion or rash.  Neurological:     General: No focal deficit present.     Mental Status: She is alert and oriented to person, place, and time.     Sensory: No sensory deficit.     Motor: No weakness or abnormal muscle tone.     Coordination: Coordination normal.  Psychiatric:        Mood and Affect: Mood normal.        Behavior: Behavior normal.      Imaging: No results found.

## 2022-09-12 NOTE — Procedures (Signed)
Lumbar Epidural Steroid Injection - Interlaminar Approach with Fluoroscopic Guidance  Patient: Christina Decker      Date of Birth: [birth year] MRN: [id number] PCP: Danna Hefty, DO      Visit Date: 09/06/2022   Universal Protocol:     Consent Given By: the patient  Position: PRONE  Additional Comments: Vital signs were monitored before and after the procedure. Patient was prepped and draped in the usual sterile fashion. The correct patient, procedure, and site was verified.   Injection Procedure Details:   Procedure diagnoses: Lumbar radiculopathy [M54.16]   Meds Administered:  Meds ordered this encounter  Medications   methylPREDNISolone acetate (DEPO-MEDROL) injection 80 mg     Laterality: Left  Location/Site:  L4-5  Needle: 3.5 in., 20 ga. Tuohy  Needle Placement: Paramedian epidural  Findings:   -Comments: Excellent flow of contrast into the epidural space.  Procedure Details: Using a paramedian approach from the side mentioned above, the region overlying the inferior lamina was localized under fluoroscopic visualization and the soft tissues overlying this structure were infiltrated with 4 ml. of 1% Lidocaine without Epinephrine. The Tuohy needle was inserted into the epidural space using a paramedian approach.   The epidural space was localized using loss of resistance along with counter oblique bi-planar fluoroscopic views.  After negative aspirate for air, blood, and CSF, a 2 ml. volume of Isovue-250 was injected into the epidural space and the flow of contrast was observed. Radiographs were obtained for documentation purposes.    The injectate was administered into the level noted above.   Additional Comments:  No complications occurred Dressing: 2 x 2 sterile gauze and Band-Aid    Post-procedure details: Patient was observed during the procedure. Post-procedure instructions were reviewed.  Patient left the clinic in stable condition.

## 2022-10-03 ENCOUNTER — Ambulatory Visit: Payer: Medicare Other | Admitting: Orthopaedic Surgery

## 2022-10-03 ENCOUNTER — Encounter: Payer: Self-pay | Admitting: Orthopaedic Surgery

## 2022-10-03 VITALS — BP 149/100 | HR 104 | Ht 65.5 in | Wt 197.0 lb

## 2022-10-03 DIAGNOSIS — M5416 Radiculopathy, lumbar region: Secondary | ICD-10-CM | POA: Diagnosis not present

## 2022-10-03 DIAGNOSIS — M79604 Pain in right leg: Secondary | ICD-10-CM | POA: Diagnosis not present

## 2022-10-03 DIAGNOSIS — M79605 Pain in left leg: Secondary | ICD-10-CM

## 2022-10-03 DIAGNOSIS — M545 Low back pain, unspecified: Secondary | ICD-10-CM

## 2022-10-03 DIAGNOSIS — G8929 Other chronic pain: Secondary | ICD-10-CM | POA: Diagnosis not present

## 2022-10-03 NOTE — Progress Notes (Signed)
My back is hurting again.  She had epidural recently by Dr. Alvester Morin and is in need of a second one.  She is to call his office later today to arrange.  She has no weakness but has some pain down the right side at times.  She also has some bilateral knee pain, mild.  I will check them next visit.  Spine/Pelvis examination:  Inspection:  Overall, sacoiliac joint benign and hips nontender; without crepitus or defects.   Thoracic spine inspection: Alignment normal without kyphosis present   Lumbar spine inspection:  Alignment  with normal lumbar lordosis, without scoliosis apparent.   Thoracic spine palpation:  without tenderness of spinal processes   Lumbar spine palpation: without tenderness of lumbar area; without tightness of lumbar muscles    Range of Motion:   Lumbar flexion, forward flexion is normal without pain or tenderness    Lumbar extension is full without pain or tenderness   Left lateral bend is normal without pain or tenderness   Right lateral bend is normal without pain or tenderness   Straight leg raising is normal  Strength & tone: normal   Stability overall normal stability  Encounter Diagnoses  Name Primary?   Lumbar radiculopathy Yes   Chronic radicular lumbar pain    Lumbar pain with radiation down both legs    Return in one month.  Check her knees then.  See Dr. Alvester Morin.  Call if any problem.  Precautions discussed.  Electronically Signed Darreld Mclean, MD 4/17/20249:33 AM

## 2022-10-05 ENCOUNTER — Telehealth: Payer: Self-pay | Admitting: Physical Medicine and Rehabilitation

## 2022-10-05 NOTE — Telephone Encounter (Signed)
Patient called. She would like an appointment with Dr. Alvester Morin. Her call back number is [phone]

## 2022-10-08 ENCOUNTER — Other Ambulatory Visit: Payer: Self-pay | Admitting: Physical Medicine and Rehabilitation

## 2022-10-08 DIAGNOSIS — M5416 Radiculopathy, lumbar region: Secondary | ICD-10-CM

## 2022-10-08 NOTE — Progress Notes (Signed)
Per patient greater than 80% relief of pain for about 1 week with previous interlaminar injection on 09/06/2022.

## 2022-10-08 NOTE — Telephone Encounter (Signed)
Patient calling in to request another injection. She states saw Dr. Hilda Lias on 10/03/22 and she states he informed her to try another injection. Last injection on 09/06/22 worked about a week at 70%. She thinks she was doing to much and the pain returned. Please advise.

## 2022-10-23 ENCOUNTER — Other Ambulatory Visit: Payer: Self-pay

## 2022-10-23 ENCOUNTER — Ambulatory Visit: Payer: Medicare Other | Admitting: Physical Medicine and Rehabilitation

## 2022-10-23 VITALS — BP 130/81 | HR 85

## 2022-10-23 DIAGNOSIS — M5416 Radiculopathy, lumbar region: Secondary | ICD-10-CM

## 2022-10-23 MED ORDER — METHYLPREDNISOLONE ACETATE 80 MG/ML IJ SUSP
80.0000 mg | Freq: Once | INTRAMUSCULAR | Status: AC
Start: 2022-10-23 — End: 2022-10-23
  Administered 2022-10-23: 80 mg

## 2022-10-23 NOTE — Patient Instructions (Signed)

## 2022-10-23 NOTE — Progress Notes (Signed)
Functional Pain Scale - descriptive words and definitions  Distracting (5)    Aware of pain/able to complete some ADL's but limited by pain/sleep is affected and active distractions are only slightly useful. Moderate range order  Average Pain 6   +Driver, -BT, -Dye Allergies.  Lower back pain across lower back that is worse on right and radiates down right leg

## 2022-10-30 NOTE — Procedures (Signed)
Lumbar Epidural Steroid Injection - Interlaminar Approach with Fluoroscopic Guidance  Patient: Christina Decker      Date of Birth: 11-14-1970 MRN: [id number] PCP: Joana Reamer, DO      Visit Date: 10/23/2022   Universal Protocol:     Consent Given By: the patient  Position: PRONE  Additional Comments: Vital signs were monitored before and after the procedure. Patient was prepped and draped in the usual sterile fashion. The correct patient, procedure, and site was verified.   Injection Procedure Details:   Procedure diagnoses: Lumbar radiculopathy [M54.16]   Meds Administered:  Meds ordered this encounter  Medications   methylPREDNISolone acetate (DEPO-MEDROL) injection 80 mg     Laterality: Left  Location/Site:  L4-5  Needle: 3.5 in., 20 ga. Tuohy  Needle Placement: Paramedian epidural  Findings:   -Comments: Excellent flow of contrast into the epidural space.  Procedure Details: Using a paramedian approach from the side mentioned above, the region overlying the inferior lamina was localized under fluoroscopic visualization and the soft tissues overlying this structure were infiltrated with 4 ml. of 1% Lidocaine without Epinephrine. The Tuohy needle was inserted into the epidural space using a paramedian approach.   The epidural space was localized using loss of resistance along with counter oblique bi-planar fluoroscopic views.  After negative aspirate for air, blood, and CSF, a 2 ml. volume of Isovue-250 was injected into the epidural space and the flow of contrast was observed. Radiographs were obtained for documentation purposes.    The injectate was administered into the level noted above.   Additional Comments:  The patient tolerated the procedure well Dressing: 2 x 2 sterile gauze and Band-Aid    Post-procedure details: Patient was observed during the procedure. Post-procedure instructions were reviewed.  Patient left the clinic in stable  condition.

## 2022-10-30 NOTE — Progress Notes (Signed)
Christina Decker - 52 y.o. female MRN [id number]  Date of birth: 03-15-1971  Office Visit Note: Visit Date: 10/23/2022 PCP: Joana Reamer, DO Referred by: Joana Reamer, DO  Subjective: Chief Complaint  Patient presents with   Lower Back - Pain   HPI:  Christina Decker is a 52 y.o. female who comes in today at the request of Dr. Darreld Mclean for planned Left L4-5 Lumbar Interlaminar epidural steroid injection with fluoroscopic guidance.  The patient has failed conservative care including home exercise, medications, time and activity modification.  This injection will be diagnostic and hopefully therapeutic.  Please see requesting physician notes for further details and justification.   ROS Otherwise per HPI.  Assessment & Plan: Visit Diagnoses:    ICD-10-CM   1. Lumbar radiculopathy  M54.16 XR C-ARM NO REPORT    Epidural Steroid injection    methylPREDNISolone acetate (DEPO-MEDROL) injection 80 mg      Plan: No additional findings.   Meds & Orders:  Meds ordered this encounter  Medications   methylPREDNISolone acetate (DEPO-MEDROL) injection 80 mg    Orders Placed This Encounter  Procedures   XR C-ARM NO REPORT   Epidural Steroid injection    Follow-up: Return for visit to requesting provider as needed.   Procedures: No procedures performed  Lumbar Epidural Steroid Injection - Interlaminar Approach with Fluoroscopic Guidance  Patient: Christina Decker      Date of Birth: [birth year] MRN: [id number] PCP: Joana Reamer, DO      Visit Date: 10/23/2022   Universal Protocol:     Consent Given By: the patient  Position: PRONE  Additional Comments: Vital signs were monitored before and after the procedure. Patient was prepped and draped in the usual sterile fashion. The correct patient, procedure, and site was verified.   Injection Procedure Details:   Procedure diagnoses: Lumbar radiculopathy [M54.16]   Meds Administered:  Meds ordered this encounter   Medications   methylPREDNISolone acetate (DEPO-MEDROL) injection 80 mg     Laterality: Left  Location/Site:  L4-5  Needle: 3.5 in., 20 ga. Tuohy  Needle Placement: Paramedian epidural  Findings:   -Comments: Excellent flow of contrast into the epidural space.  Procedure Details: Using a paramedian approach from the side mentioned above, the region overlying the inferior lamina was localized under fluoroscopic visualization and the soft tissues overlying this structure were infiltrated with 4 ml. of 1% Lidocaine without Epinephrine. The Tuohy needle was inserted into the epidural space using a paramedian approach.   The epidural space was localized using loss of resistance along with counter oblique bi-planar fluoroscopic views.  After negative aspirate for air, blood, and CSF, a 2 ml. volume of Isovue-250 was injected into the epidural space and the flow of contrast was observed. Radiographs were obtained for documentation purposes.    The injectate was administered into the level noted above.   Additional Comments:  The patient tolerated the procedure well Dressing: 2 x 2 sterile gauze and Band-Aid    Post-procedure details: Patient was observed during the procedure. Post-procedure instructions were reviewed.  Patient left the clinic in stable condition.   Clinical History: MRI LUMBAR SPINE WITHOUT CONTRAST   TECHNIQUE: Multiplanar, multisequence MR imaging of the lumbar spine was performed. No intravenous contrast was administered.   COMPARISON:  Lumbar spine radiographs 07/10/2022.   FINDINGS: Segmentation: Conventional numbering is assumed with 5 non-rib-bearing, lumbar type vertebral bodies.   Alignment:  Unchanged grade 1 anterolisthesis of L4 on L5.  Vertebrae: Benign vertebral hemangioma at L3. No suspicious marrow lesions.   Conus medullaris and cauda equina: Conus extends to the L1 level. Conus and cauda equina appear normal.   Paraspinal and other  soft tissues: Unremarkable.   Disc levels:   T12-L1:  Normal.   L1-L2: Small disc bulge and mild bilateral facet arthropathy. No spinal canal stenosis or neural foraminal narrowing.   L2-L3: No disc herniation, spinal canal stenosis or neural foraminal narrowing. Mild bilateral facet arthropathy.   L3-L4: No disc herniation, spinal canal stenosis or neural foraminal narrowing. Mild bilateral facet arthropathy.   L4-L5: Anterolisthesis with uncovered disc and moderate bilateral facet arthropathy results in mild spinal canal stenosis. No significant neural foraminal narrowing.   L5-S1: No disc herniation, spinal canal stenosis or neural foraminal narrowing. Moderate bilateral facet arthropathy.   IMPRESSION: Multilevel facet arthropathy, worst at L4-L5, where there is grade 1 anterolisthesis and mild spinal canal stenosis. No significant neural foraminal narrowing at any level.     Electronically Signed   By: Orvan Falconer M.D.   On: 08/16/2022 11:21     Objective:  VS:  HT:    WT:   BMI:     BP:130/81  HR:85bpm  TEMP: ( )  RESP:  Physical Exam Vitals and nursing note reviewed.  Constitutional:      General: She is not in acute distress.    Appearance: Normal appearance. She is not ill-appearing.  HENT:     Head: Normocephalic and atraumatic.     Right Ear: External ear normal.     Left Ear: External ear normal.  Eyes:     Extraocular Movements: Extraocular movements intact.  Cardiovascular:     Rate and Rhythm: Normal rate.     Pulses: Normal pulses.  Pulmonary:     Effort: Pulmonary effort is normal. No respiratory distress.  Abdominal:     General: There is no distension.     Palpations: Abdomen is soft.  Musculoskeletal:        General: Tenderness present.     Cervical back: Neck supple.     Right lower leg: No edema.     Left lower leg: No edema.     Comments: Patient has good distal strength with no pain over the greater trochanters.  No clonus  or focal weakness.  Skin:    Findings: No erythema, lesion or rash.  Neurological:     General: No focal deficit present.     Mental Status: She is alert and oriented to person, place, and time.     Sensory: No sensory deficit.     Motor: No weakness or abnormal muscle tone.     Coordination: Coordination normal.  Psychiatric:        Mood and Affect: Mood normal.        Behavior: Behavior normal.      Imaging: No results found.

## 2022-10-31 ENCOUNTER — Ambulatory Visit: Payer: Medicare Other | Admitting: Orthopaedic Surgery

## 2023-01-21 NOTE — Progress Notes (Unsigned)
Christina Decker, female    DOB: 29-Sep-1970    MRN: [id number]   Brief patient profile:  23  yobf  never smoker/ pre-eclampsia with 2nd IUP with post- partum hypertension / afib  referred to pulmonary clinic in [place]  01/22/2023 by Dr Mauri Reading  for restrictive changes on pfts 12/06/22 with doe x  2022 esp since June 2024 / cards is @ [place] [facility] Dr Nydia Bouton  Baseline wt p 1st IUP with wt 130 but never lost wt p 2nd IUP 180  and peak 212     History of Present Illness  01/22/2023  Pulmonary/ 1st office eval/ [facility] / [place] [facility]  Chief Complaint  Patient presents with   Establish Care   Shortness of Breath  Dyspnea:  mostly housebound/uses HC parking  Cough: intermittent  Sleep: sleeps on couch arm rest for breathing and back  SABA use: none  02: none  Some leg swelling but since June no worse than usual   No obvious day to day or daytime pattern/variability or assoc excess/ purulent sputum or mucus plugs or hemoptysis or cp or chest tightness, subjective wheeze or overt sinus or hb symptoms.     Also denies any obvious fluctuation of symptoms with weather or environmental changes or other aggravating or alleviating factors except as outlined above   No unusual exposure hx or h/o childhood pna/ asthma or knowledge of premature birth.  Current Allergies, Complete Past Medical History, Past Surgical History, Family History, and Social History were reviewed in [employer] record.  ROS  The following are not active complaints unless bolded Hoarseness, sore throat, dysphagia, dental problems, itching, sneezing,  nasal congestion or discharge of excess mucus or purulent secretions, ear ache,   fever, chills, sweats, unintended wt loss or wt gain, classically pleuritic or exertional cp,  orthopnea pnd or arm/hand swelling  or leg swelling, presyncope, palpitations, abdominal pain, anorexia, nausea, vomiting, diarrhea  or change in bowel habits or change in bladder  habits, change in stools or change in urine, dysuria, hematuria,  rash, arthralgias, visual complaints, headache, numbness, weakness or ataxia or problems with walking/uses walker due to back pain or coordination,  change in mood or  memory.              Outpatient Medications Prior to Visit -  - NOTE:   Unable to verify as accurately reflecting what pt takes    Medication Sig Dispense Refill   acetaminophen (TYLENOL) 500 MG tablet Take 1,000 mg by mouth every 6 (six) hours as needed for moderate pain.     amLODipine (NORVASC) 5 MG tablet Take 1 tablet by mouth daily.     amphetamine-dextroamphetamine (ADDERALL) 20 MG tablet Take 20 mg by mouth daily.     Aspirin 81 MG CAPS Take 1 tablet by mouth.     busPIRone (BUSPAR) 10 MG tablet Take 1 tablet by mouth daily.     chlorthalidone (HYGROTON) 25 MG tablet 1 tablet in the morning with food Orally Once a day     Cholecalciferol (VITAMIN D3) 50 MCG (2000 UT) CAPS Take 1 capsule by mouth.     clonazePAM (KLONOPIN) 1 MG tablet 1 tablet Orally Once a day     cyclobenzaprine (FLEXERIL) 10 MG tablet Take 1 tablet by mouth in the morning and at bedtime.     diazepam (VALIUM) 10 MG tablet Take 10 mg by mouth every 6 (six) hours as needed for anxiety.      DULoxetine (  CYMBALTA) 60 MG capsule Take 1 capsule by mouth daily.     HYDROcodone-acetaminophen (NORCO/VICODIN) 5-325 MG tablet One tablet every four hours for pain. 30 tablet 0   Magnesium 250 MG TABS Take 1 tablet by mouth daily.     metFORMIN (GLUCOPHAGE-XR) 750 MG 24 hr tablet Take 1 tablet by mouth daily.     metoprolol (LOPRESSOR) 100 MG tablet Take 100 mg by mouth 2 (two) times daily.     mirabegron ER (MYRBETRIQ) 25 MG TB24 tablet Take 1 tablet by mouth daily.     naproxen (NAPROSYN) 500 MG tablet Take 1 tablet (500 mg total) by mouth 2 (two) times daily with a meal. 60 tablet 5   omeprazole (PRILOSEC) 20 MG capsule Take 20 mg by mouth daily as needed.     omeprazole (PRILOSEC) 20 MG capsule  Take 1 capsule by mouth daily.     ONETOUCH ULTRA test strip USE 1 STRIP TO CHECK GLUCOSE ONCE DAILY     oxyCODONE (ROXICODONE) 5 MG immediate release tablet Take 1 tablet (5 mg total) by mouth every 6 (six) hours as needed for severe pain. 15 tablet 0   OZEMPIC, 0.25 OR 0.5 MG/DOSE, 2 MG/3ML SOPN Inject 0.25 mg into the skin once a week.     polyethylene glycol powder (GLYCOLAX/MIRALAX) 17 GM/SCOOP powder MIX ONE CAPFUL IN 8 OZ OF WATER BY MOUTH DAILY FOR CONSTIPATION     promethazine-dextromethorphan (PROMETHAZINE-DM) 6.25-15 MG/5ML syrup Take 5 mLs by mouth every 6 (six) hours as needed.     rosuvastatin (CRESTOR) 20 MG tablet Take 1 tablet by mouth.     SUMAtriptan (IMITREX) 25 MG tablet Take 1 tablet by mouth.     amphetamine-dextroamphetamine (ADDERALL XR) 15 MG 24 hr capsule Take 15 mg by mouth daily.     No facility-administered medications prior to visit.    Past Medical History:  Diagnosis Date   Abnormal heart rhythms    Abnormal thyroid stimulating hormone level    Anxiety    Balance problem    Cervical disc disease    Depression    Diabetes mellitus without complication (HCC)    Dyslipidemia    Dyspareunia due to medical condition in female    Dysrhythmia    Environmental and seasonal allergies    Fatty liver disease, nonalcoholic    Fibrocystic breast disease    Fibromuscular hyperplasia (HCC)    Fibromuscular hyperplasia (HCC)    Fibromyalgia    Fibromyalgia    Hemorrhoid    Hx of adenomatous colonic polyps    Hyperkalemia    Hypertension    IBS (irritable bowel syndrome)    IBS (irritable bowel syndrome)    Low back pain    Lower extremity edema    Migraine    Narcolepsy    Narcolepsy and cataplexy    Pain in left arm    Pain in left shoulder    Syncope    Thyroid disease    Vitamin D deficiency       Objective:     BP 128/78   Pulse (!) 127   Ht 5' 5.5" (1.664 m)   Wt 191 lb (86.6 kg)   SpO2 97%   BMI 31.30 kg/m   SpO2: 97 %  RA  - did  not take Lopressor 100 mg  p took adderal prior to ov    HEENT : Oropharynx  clear      Nasal turbinates nl    NECK :  without  apparent JVD/ palpable Nodes/TM    LUNGS: no acc muscle use,  Nl contour chest which is clear to A and P bilaterally without cough on insp or exp maneuvers   CV:  RRR (not ectopy at all) with Apical pulse 120 with ? click  no s3 or murmur or increase in P2, and no edema   ABD:  obese soft and nontender with mildly limited inspiratory excursion in the supine position. No bruits or organomegaly appreciated   MS:  Nl gait/ ext warm without deformities Or obvious joint restrictions  calf tenderness, cyanosis or clubbing    SKIN: warm and dry without lesions    NEURO:  alert, approp, nl sensorium with  no motor or cerebellar deficits apparent.    I personally reviewed images and agree with radiology impression as follows:   Chest CT portion of coronary ct    05/08/23      No definite abnormality seen involving the visualized extracardiac structures of the chest. Assessment   DOE (dyspnea on exertion) Never smoker/ no childhood resp problems/ dx's - onset 2022 assoc with palpitations pt reports as AFIB with f/u at [facility] clinic - Spirometry   FEV1 2.39 (85%)  Ratio 0.93  at wt 198  c/w body habitus effects  Symptoms are  disproportionate to objective findings and not clear to what extent this is actually a pulmonary  problem but pt does appear to have difficult to sort out respiratory symptoms of unknown origin for which  DDX  = almost all start with A and  include Adherence, Ace Inhibitors, Acid Reflux, Active Sinus Disease, Alpha 1 Antitripsin deficiency, Anxiety masquerading as Airways dz,  ABPA,  Allergy(esp in young), Aspiration (esp in elderly), Adverse effects of meds,  Active smoking or Vaping, A bunch of PE's/clot burden (a few small clots can't cause this syndrome unless there is already severe underlying pulm or vascular dz with poor reserve),  Anemia  or thyroid disorder, plus two Bs  = Bronchiectasis and Beta blocker use..and one C= CHF    Most likely dx's in bold  Adherence is always the initial "prime suspect" and is a multilayered concern that requires a "trust but verify" approach in every patient - starting with knowing how to use medications, especially inhalers, correctly, keeping up with refills and understanding the fundamental difference between maintenance and prns vs those medications only taken for a very short course and then stopped and not refilled.  - did not take am dose of lopressor 100 p she did take her adderal and appears to have ST on exam/ cards to see 8/7  Anxiety/ fibromyalgia/deconditioning with chronic pain probably contributing to symptoms, rec rx per PCP and ex program once cleared by cards (water aerobics/ recumbent bicycle come to mind)  Resting sats today 97% but HR 127 and worse symptoms assoc with worse palpitations since June 2024 ? All cardiac plus element of deconditioning.  Since seeing cards tomorrow and missed am dose of lopressor 100 suggested she take it now and prior to ov tomorrow with additional w/u with bmet/ cbc/ d dimer and BNP suggested (TSH ok per PCP)   Adverse effects of meds:  adderal is the only med on list  that may contibute to palpitations/ sob   A bunch of PE's > main risk is obesity/ sedentary lifestyle - symptoms are chronic not acute but D dimer helpful here because even though a nl value may miss small peripheral pe, the clot burden with sob is moderately  high and the d dimer  has a very high neg pred value if used in this setting.  CHF / ? Diastolic dysfunction >  note ok coronary study 05/07/22   and nl echo 08/12/20   Since seeing cards tomorrow I deferred labs but would consider d dimer/ BNP /cbc / tsh unless recently checked by Dr Mauri Reading.  Pulmonary f/u is prn once wt and cards issues are addressed if not seeing significant improvement with ex eg water aerobics/ recumbent  bicycle            Total time for H and P, chart review, counseling,   and generating customized AVS unique to this office visit / same day charting = 60 min new pt eval           Sandrea Hughs, MD 01/22/2023

## 2023-01-22 ENCOUNTER — Ambulatory Visit: Payer: Medicare Other | Admitting: Internal Medicine

## 2023-01-22 ENCOUNTER — Encounter: Payer: Self-pay | Admitting: Internal Medicine

## 2023-01-22 VITALS — BP 128/78 | HR 127 | Ht 65.5 in | Wt 191.0 lb

## 2023-01-22 DIAGNOSIS — R0609 Other forms of dyspnea: Secondary | ICD-10-CM | POA: Diagnosis not present

## 2023-01-22 NOTE — Patient Instructions (Signed)
I can't recommend any exercise program that aggravates your heart rate or your back pain   I will review your data from Dr Mauri Reading' office when available and let you know if additional work up is needed  Keep up the weight loss - Pulmonary follow up is as needed

## 2023-01-22 NOTE — Assessment & Plan Note (Signed)
Never smoker/ no childhood resp problems/ dx's - onset 2022 assoc with palpitations pt reports as AFIB with f/u at [facility] clinic - Spirometry   FEV1 2.39 (85%)  Ratio 0.93  at wt 198  c/w body habitus effects  Symptoms are  disproportionate to objective findings and not clear to what extent this is actually a pulmonary  problem but pt does appear to have difficult to sort out respiratory symptoms of unknown origin for which  DDX  = almost all start with A and  include Adherence, Ace Inhibitors, Acid Reflux, Active Sinus Disease, Alpha 1 Antitripsin deficiency, Anxiety masquerading as Airways dz,  ABPA,  Allergy(esp in young), Aspiration (esp in elderly), Adverse effects of meds,  Active smoking or Vaping, A bunch of PE's/clot burden (a few small clots can't cause this syndrome unless there is already severe underlying pulm or vascular dz with poor reserve),  Anemia or thyroid disorder, plus two Bs  = Bronchiectasis and Beta blocker use..and one C= CHF    Most likely dx's in bold  Adherence is always the initial "prime suspect" and is a multilayered concern that requires a "trust but verify" approach in every patient - starting with knowing how to use medications, especially inhalers, correctly, keeping up with refills and understanding the fundamental difference between maintenance and prns vs those medications only taken for a very short course and then stopped and not refilled.  - did not take am dose of lopressor 100 p she did take her adderal and appears to have ST on exam/ cards to see 8/7  Anxiety/ fibromyalgia/deconditioning with chronic pain probably contributing to symptoms, rec rx per PCP and ex program once cleared by cards (water aerobics/ recumbent bicycle come to mind)  Resting sats today 97% but HR 127 and worse symptoms assoc with worse palpitations since June 2024 ? All cardiac plus element of deconditioning.  Since seeing cards tomorrow and missed am dose of lopressor 100 suggested  she take it now and prior to ov tomorrow with additional w/u with bmet/ cbc/ d dimer and BNP suggested (TSH ok per PCP)   Adverse effects of meds:  adderal is the only med on list  that may contibute to palpitations/ sob   A bunch of PE's > main risk is obesity/ sedentary lifestyle - symptoms are chronic not acute but D dimer helpful here because even though a nl value may miss small peripheral pe, the clot burden with sob is moderately high and the d dimer  has a very high neg pred value if used in this setting.  CHF / ? Diastolic dysfunction >  note ok coronary study 05/07/22   and nl echo 08/12/20   Since seeing cards tomorrow I deferred labs but would consider d dimer/ BNP /cbc / tsh unless recently checked by Dr Mauri Reading.  Pulmonary f/u is prn once wt and cards issues are addressed if not seeing significant improvement with ex eg water aerobics/ recumbent bicycle            Total time for H and P, chart review, counseling,   and generating customized AVS unique to this office visit / same day charting = 60 min new pt eval

## 2023-04-06 ENCOUNTER — Other Ambulatory Visit: Payer: Self-pay

## 2023-04-06 ENCOUNTER — Emergency Department
Admission: EM | Admit: 2023-04-06 | Discharge: 2023-04-06 | Disposition: A | Discharge status: Home / Self Care | Payer: Medicare Other | Attending: Emergency Medicine | Admitting: Emergency Medicine

## 2023-04-06 DIAGNOSIS — I10 Essential (primary) hypertension: Secondary | ICD-10-CM | POA: Insufficient documentation

## 2023-04-06 DIAGNOSIS — E876 Hypokalemia: Secondary | ICD-10-CM | POA: Insufficient documentation

## 2023-04-06 DIAGNOSIS — R55 Syncope and collapse: Secondary | ICD-10-CM | POA: Insufficient documentation

## 2023-04-06 DIAGNOSIS — E119 Type 2 diabetes mellitus without complications: Secondary | ICD-10-CM | POA: Insufficient documentation

## 2023-04-06 LAB — BASIC METABOLIC PANEL
Anion gap: 13 (ref 5–15)
BUN: 9 mg/dL (ref 6–20)
CO2: 25 mmol/L (ref 22–32)
Calcium: 9.2 mg/dL (ref 8.9–10.3)
Chloride: 101 mmol/L (ref 98–111)
Creatinine, Ser: 0.91 mg/dL (ref 0.44–1.00)
GFR, Estimated: >60 mL/min (ref >60)
Glucose, Bld: 111 mg/dL — ABNORMAL HIGH (ref 70–99)
Potassium: 2.7 mmol/L — CL (ref 3.5–5.1)
Sodium: 139 mmol/L (ref 135–145)

## 2023-04-06 LAB — CBC
HCT: 39.1 % (ref 36.0–46.0)
Hemoglobin: 13 g/dL (ref 12.0–15.0)
MCH: 28.3 pg (ref 26.0–34.0)
MCHC: 33.2 g/dL (ref 30.0–36.0)
MCV: 85 fL (ref 80.0–100.0)
Platelets: 366 10*3/uL (ref 150–400)
RBC: 4.6 MIL/uL (ref 3.87–5.11)
RDW: 13.5 % (ref 11.5–15.5)
WBC: 10 10*3/uL (ref 4.0–10.5)
nRBC: 0 % (ref 0.0–0.2)

## 2023-04-06 LAB — MAGNESIUM: Magnesium: 2 mg/dL (ref 1.7–2.4)

## 2023-04-06 LAB — TROPONIN I (HIGH SENSITIVITY): Troponin I (High Sensitivity): 6 ng/L (ref <18)

## 2023-04-06 MED ORDER — POTASSIUM CHLORIDE 10 MEQ/100ML IV SOLN
10.0000 meq | INTRAVENOUS | Status: AC
  Administered 2023-04-06 (×2): 10 meq via INTRAVENOUS
  Filled 2023-04-06 (×2): qty 100

## 2023-04-06 MED ORDER — POTASSIUM CHLORIDE CRYS ER 20 MEQ PO TBCR: 20.0000 meq | EXTENDED_RELEASE_TABLET | Freq: Every day | ORAL | 0 refills | Status: AC

## 2023-04-06 MED ORDER — POTASSIUM CHLORIDE CRYS ER 20 MEQ PO TBCR
40.0000 meq | EXTENDED_RELEASE_TABLET | Freq: Once | ORAL | Status: AC
  Administered 2023-04-06: 40 meq via ORAL
  Filled 2023-04-06: qty 2

## 2023-04-06 MED ORDER — LACTATED RINGERS IV BOLUS
1000.0000 mL | Freq: Once | INTRAVENOUS | Status: AC
  Administered 2023-04-06: 1000 mL via INTRAVENOUS

## 2023-04-06 NOTE — ED Provider Notes (Signed)
[facility] Provider Note    Event Date/Time   First MD Initiated Contact with Patient 04/06/23 1530     (approximate)   History   Chief Complaint Loss of Consciousness   HPI  Christina Decker is a 52 y.o. female with past medical history of hypertension, diabetes, fibromyalgia, anxiety, and cervical myelopathy who presents to the ED complaining of syncope.  Patient reports that 2 nights ago she was walking into her bedroom when she began to feel lightheaded and everything "went black."  She denies any associated chest pain or shortness of breath, was evaluated in the [facility] ED following the episode, where she states that her testing was normal and she was discharged home.  She has not had any further episodes of syncope since then, but reports feeling very weak and lightheaded.  She has not had any fevers, cough, nausea, vomiting, diarrhea, or dysuria.  She reports similar episodes of syncope in the past, has been seen by cardiology for this with unremarkable workup.     Physical Exam   Triage Vital Signs: ED Triage Vitals  Encounter Vitals Group     BP 04/06/23 1334 (!) 129/94     Systolic BP Percentile --      Diastolic BP Percentile --      Pulse Rate 04/06/23 1334 (!) 107     Resp 04/06/23 1334 19     Temp 04/06/23 1334 98.9 F (37.2 C)     Temp Source 04/06/23 1334 Oral     SpO2 04/06/23 1334 97 %     Weight 04/06/23 1345 185 lb (83.9 kg)     Height 04/06/23 1345 5\' 5"  (1.651 m)     Head Circumference --      Peak Flow --      Pain Score 04/06/23 1345 0     Pain Loc --      Pain Education --      Exclude from Growth Chart --     Most recent vital signs: Vitals:   04/06/23 1630 04/06/23 1700  BP: (!) 146/85 (!) 153/90  Pulse: 92 95  Resp: 18 20  Temp:    SpO2: 100% 100%    Constitutional: Alert and oriented. Eyes: Conjunctivae are normal. Head: Atraumatic. Nose: No congestion/rhinnorhea. Mouth/Throat: Mucous membranes are  moist.  Cardiovascular: Normal rate, regular rhythm. Grossly normal heart sounds.  2+ radial pulses bilaterally. Respiratory: Normal respiratory effort.  No retractions. Lungs CTAB. Gastrointestinal: Soft and nontender. No distention. Musculoskeletal: No lower extremity tenderness nor edema.  Neurologic:  Normal speech and language. No gross focal neurologic deficits are appreciated.    ED Results / Procedures / Treatments   Labs (all labs ordered are listed, but only abnormal results are displayed) Labs Reviewed  BASIC METABOLIC PANEL - Abnormal; Notable for the following components:      Result Value   Potassium 2.7 (*)    Glucose, Bld 111 (*)    All other components within normal limits  CBC  MAGNESIUM  TROPONIN I (HIGH SENSITIVITY)     EKG  ED ECG REPORT I, Chesley Noon, the attending physician, personally viewed and interpreted this ECG.   Date: 04/06/2023  EKG Time: 13:38  Rate: 99  Rhythm: normal sinus rhythm  Axis: Normal  Intervals:none  ST&T Change: None  PROCEDURES:  Critical Care performed: No  Procedures   MEDICATIONS ORDERED IN ED: Medications  potassium chloride 10 mEq in 100 mL IVPB (10 mEq Intravenous New  Bag/Given 04/06/23 1758)  lactated ringers bolus 1,000 mL (1,000 mLs Intravenous New Bag/Given 04/06/23 1618)  potassium chloride SA (KLOR-CON M) CR tablet 40 mEq (40 mEq Oral Given 04/06/23 1617)     IMPRESSION / MDM / ASSESSMENT AND PLAN / ED COURSE  I reviewed the triage vital signs and the nursing notes.                              52 y.o. female with past medical history of hypertension, diabetes, fibromyalgia, anxiety, and cervical myelopathy who presents to the ED complaining of syncopal episode 2 nights ago, has been feeling weak and lightheaded since then.  Patient's presentation is most consistent with acute presentation with potential threat to life or bodily function.  Differential diagnosis includes, but is not limited  to, arrhythmia, vasovagal episode, orthostatic hypotension, ACS, PE, anemia, lecture abnormality, AKI.  Patient nontoxic-appearing and in no acute distress, vital signs are remarkable for tachycardia but otherwise reassuring.  EKG shows normal sinus rhythm with no ischemic changes, symptoms sound less likely to be cardiac in etiology but will add on troponin and observe on cardiac monitor.  No significant anemia, leukocytosis, or AKI noted, but patient does have significant hypokalemia which we will replete, also add on magnesium level.  Will hydrate with IV fluids and reassess.  Troponin within normal limits and no events noted on cardiac monitor, suspect vasovagal episode given similar episodes in the past.  Magnesium level within normal limits, patient reports feeling better following IV fluids and supplemental potassium.  She is appropriate for outpatient management, we will prescribe 5 days of supplemental potassium and she was counseled to follow-up with her PCP for recheck of electrolytes.  She was counseled to return to the ED for new or worsening symptoms, patient agrees with plan.      FINAL CLINICAL IMPRESSION(S) / ED DIAGNOSES   Final diagnoses:  Syncope and collapse  Hypokalemia     Rx / DC Orders   ED Discharge Orders          Ordered    potassium chloride SA (KLOR-CON M) 20 MEQ tablet  Daily        04/06/23 1824             Note:  This document was prepared using Dragon voice recognition software and may include unintentional dictation errors.   Chesley Noon, MD 04/06/23 [phone]

## 2023-04-06 NOTE — ED Triage Notes (Signed)
Pt to ED for syncopal episode "the other night", unsure of hitting head. Was evaluated at a different hospital. Pt states "I just dont feel right, feel like I'm gonna black out, I'm weak, I just can't function". NAD noted, RR even and unlabored.

## 2023-04-16 ENCOUNTER — Encounter (HOSPITAL_COMMUNITY): Payer: Self-pay | Admitting: Radiology

## 2023-04-16 ENCOUNTER — Other Ambulatory Visit (HOSPITAL_COMMUNITY): Payer: Self-pay | Admitting: Family Medicine

## 2023-04-16 ENCOUNTER — Ambulatory Visit (HOSPITAL_COMMUNITY)
Admission: RE | Admit: 2023-04-16 | Discharge: 2023-04-16 | Disposition: A | Discharge status: Home / Self Care | Payer: Medicare Other | Source: Ambulatory Visit | Attending: Family Medicine | Admitting: Family Medicine

## 2023-04-16 DIAGNOSIS — R000 Tachycardia, unspecified: Secondary | ICD-10-CM | POA: Diagnosis present

## 2023-04-16 DIAGNOSIS — R7989 Other specified abnormal findings of blood chemistry: Secondary | ICD-10-CM | POA: Diagnosis present

## 2023-04-16 DIAGNOSIS — R55 Syncope and collapse: Secondary | ICD-10-CM | POA: Insufficient documentation

## 2023-04-16 DIAGNOSIS — R0602 Shortness of breath: Secondary | ICD-10-CM | POA: Diagnosis present

## 2023-04-16 MED ORDER — IOHEXOL 350 MG/ML SOLN
75.0000 mL | Freq: Once | INTRAVENOUS | Status: AC | PRN
  Administered 2023-04-16: 75 mL via INTRAVENOUS

## 2023-07-10 ENCOUNTER — Encounter: Payer: Self-pay | Admitting: Orthopaedic Surgery

## 2023-07-10 ENCOUNTER — Ambulatory Visit: Payer: Medicare Other | Admitting: Orthopaedic Surgery

## 2023-07-10 VITALS — BP 142/108 | HR 95 | Ht 65.0 in | Wt 170.0 lb

## 2023-07-10 DIAGNOSIS — G8929 Other chronic pain: Secondary | ICD-10-CM

## 2023-07-10 DIAGNOSIS — M545 Low back pain, unspecified: Secondary | ICD-10-CM

## 2023-07-10 DIAGNOSIS — M4726 Other spondylosis with radiculopathy, lumbar region: Secondary | ICD-10-CM

## 2023-07-10 DIAGNOSIS — M5416 Radiculopathy, lumbar region: Secondary | ICD-10-CM

## 2023-07-10 DIAGNOSIS — M79605 Pain in left leg: Secondary | ICD-10-CM

## 2023-07-10 MED ORDER — TRAMADOL HCL 50 MG PO TABS: 50.0000 mg | ORAL_TABLET | Freq: Four times a day (QID) | ORAL | 2 refills | Status: AC | PRN

## 2023-07-10 NOTE — Progress Notes (Signed)
My back is worse.  She has had more lower back pain over the last six weeks. She has no trauma.  She has some right sided sciatica at times.  Last May she had epidural which only helped a short time.  She has seen her family doctor and is taking Tramadol which helps.  She has no numbness, no weakness.  ROM of back is limited secondary to pain, no spasm, muscle tone and strength normal, gait is normal, NV intact.  Encounter Diagnoses  Name Primary?   Lumbar radiculopathy Yes   Chronic radicular lumbar pain    Lumbar pain with radiation down both legs    Other spondylosis with radiculopathy, lumbar region    I will refill the Tramadol.  She had taken Naprosyn but it upset her stomach.  She has Flexeril and resume that.  I have reviewed the [place] Controlled Substance Reporting System web site prior to prescribing narcotic medicine for this patient.  I will set up PT for her.  I will see her in two weeks, even if PT has not seen her.  She may need new MRI or neurosurgery evaluation.  Call if any problem.  Precautions discussed.  Electronically Signed Darreld Mclean, MD 1/22/20252:18 PM

## 2023-07-10 NOTE — Patient Instructions (Signed)
Physical therapy has been ordered for you at [place]. They should call you to schedule, [phone] is the phone number to call, if you want to call to schedule.   

## 2023-07-17 ENCOUNTER — Ambulatory Visit (HOSPITAL_COMMUNITY): Payer: Medicare Other | Attending: Orthopaedic Surgery

## 2023-07-17 ENCOUNTER — Other Ambulatory Visit: Payer: Self-pay

## 2023-07-17 DIAGNOSIS — M4726 Other spondylosis with radiculopathy, lumbar region: Secondary | ICD-10-CM | POA: Insufficient documentation

## 2023-07-17 DIAGNOSIS — M79604 Pain in right leg: Secondary | ICD-10-CM | POA: Insufficient documentation

## 2023-07-17 DIAGNOSIS — M545 Low back pain, unspecified: Secondary | ICD-10-CM | POA: Insufficient documentation

## 2023-07-17 DIAGNOSIS — M5416 Radiculopathy, lumbar region: Secondary | ICD-10-CM | POA: Insufficient documentation

## 2023-07-17 DIAGNOSIS — G8929 Other chronic pain: Secondary | ICD-10-CM | POA: Diagnosis not present

## 2023-07-17 DIAGNOSIS — M79605 Pain in left leg: Secondary | ICD-10-CM | POA: Diagnosis not present

## 2023-07-17 DIAGNOSIS — M6281 Muscle weakness (generalized): Secondary | ICD-10-CM | POA: Insufficient documentation

## 2023-07-17 DIAGNOSIS — M5459 Other low back pain: Secondary | ICD-10-CM | POA: Diagnosis present

## 2023-07-17 NOTE — Therapy (Signed)
[facility] OUTPATIENT PHYSICAL THERAPY EVALUATION (THORACOLUMBAR)   Patient Name: Christina Decker MRN: [id number] DOB:[birth year], 53 y.o., female Today's Date: 07/17/2023  END OF SESSION:   PT End of Session - 07/17/23 1512     Visit Number 1    Number of Visits 6    Date for PT Re-Evaluation 08/28/23    Authorization Type [employer] Medicare    Authorization Time Period req 07/17/23    PT Start Time 0310    PT Stop Time 0350    PT Time Calculation (min) 40 min    Activity Tolerance Patient tolerated treatment well    Behavior During Therapy WFL for tasks assessed/performed              Past Medical History:  Diagnosis Date   Abnormal heart rhythms    Abnormal thyroid stimulating hormone level    Anxiety    Balance problem    Cervical disc disease    Depression    Diabetes mellitus without complication (HCC)    Dyslipidemia    Dyspareunia due to medical condition in female    Dysrhythmia    Environmental and seasonal allergies    Fatty liver disease, nonalcoholic    Fibrocystic breast disease    Fibromuscular hyperplasia (HCC)    Fibromuscular hyperplasia (HCC)    Fibromyalgia    Fibromyalgia    Hemorrhoid    Hx of adenomatous colonic polyps    Hyperkalemia    Hypertension    IBS (irritable bowel syndrome)    IBS (irritable bowel syndrome)    Low back pain    Lower extremity edema    Migraine    Narcolepsy    Narcolepsy and cataplexy    Pain in left arm    Pain in left shoulder    Syncope    Thyroid disease    Vitamin D deficiency    Past Surgical History:  Procedure Laterality Date   ABDOMINAL HYSTERECTOMY     ANTERIOR CERVICAL DECOMP/DISCECTOMY FUSION N/A 10/16/2017   Procedure: ANTERIOR CERVICAL DECOMPRESSION/DISCECTOMY FUSION 2 LEVELS-C5-7, C6 CORPECTOMY;  Surgeon: Venetia Night, MD;  Location: [facility] ORS;  Service: Neurosurgery;  Laterality: N/A;   COLONOSCOPY WITH PROPOFOL N/A 03/29/2022   Procedure: COLONOSCOPY WITH PROPOFOL;  Surgeon: Wyline Mood, MD;   Location: [facility] ENDOSCOPY;  Service: Gastroenterology;  Laterality: N/A;   HEMORRHOID SURGERY     HERNIA REPAIR     Patient Active Problem List   Diagnosis Date Noted   Rectal bleeding    Adenomatous polyp of colon    DOE (dyspnea on exertion) 03/13/2022   Carotid artery occlusion 09/12/2020   IBS (irritable bowel syndrome) 09/12/2020   Migraines 09/12/2020   Thyroid disease 09/12/2020   Carotid stenosis 09/09/2020   Cervical myelopathy (HCC) 10/16/2017   Essential hypertension 07/19/2016   Fibromyalgia 07/19/2016   Fibromuscular dysplasia (HCC) 07/19/2016   DJD (degenerative joint disease) 07/19/2016   Bilateral low back pain without sciatica 04/12/2014   Panic anxiety syndrome 02/18/2014   Hyperlipidemia, mixed 02/18/2014   Narcolepsy 02/18/2014   Obesity 02/18/2014   Chest pain syndrome 02/12/2014    PCP: Joana Reamer, DORef Provider (PCP)   REFERRING PROVIDER:   Darreld Mclean, MD    REFERRING DIAG:  Diagnosis  M54.16 (ICD-10-CM) - Lumbar radiculopathy  M54.16,G89.29 (ICD-10-CM) - Chronic radicular lumbar pain  M54.50,M79.604,M79.605 (ICD-10-CM) - Lumbar pain with radiation down both legs  M47.26 (ICD-10-CM) - Other spondylosis with radiculopathy, lumbar region    Rationale for Evaluation and Treatment: Rehabilitation  THERAPY DIAG:  Other low back pain  Muscle weakness (generalized)  ONSET DATE: 1+ year    SUBJECTIVE:                                                                                                                                                                                           SUBJECTIVE STATEMENT: Patient reports that in the last year, her back "gives out" to a point where she cannot walk for a few days. In October and December, patient had syncopal episodes. Patient went to Dr Hilda Lias on 07/10/23 who referred patient to OPPT  PERTINENT HISTORY:  Fibromylagia  ANTERIOR CERVICAL DECOMP/DISCECTOMY FUSION  10/16/2017  PAIN:   Are you having pain? Yes: NPRS scale: 1/10 Pain location: lumbar; can shoot down to bilateral legs  Pain description: shooting Aggravating factors: ADLs Relieving factors: varies  PRECAUTIONS: None  RED FLAGS: None   WEIGHT BEARING RESTRICTIONS: No  FALLS:  Has patient fallen in last 6 months? Yes. Number of falls 5+  LIVING ENVIRONMENT: Lives with: lives with their spouse Lives in: House/apartment Stairs: Yes: External: 5 steps; can reach both Has following equipment at home: Single point cane, Walker - 2 wheeled, Wheelchair (manual), and Tour manager How many hours do you sleep every night: < 6 hours How many minutes of moderate exercise do you get weekly: 0 min  OCCUPATION: on disability; not working   PLOF: Independent   PATIENT GOALS: To get my back better  NEXT MD VISIT: Feb 5th 2025    OBJECTIVE:   DIAGNOSTIC FINDINGS:   MRI 08/15/22 IMPRESSION: Multilevel facet arthropathy, worst at L4-L5, where there is grade 1 anterolisthesis and mild spinal canal stenosis. No significant neural foraminal narrowing at any level.  PATIENT SURVEYS:  Modified Oswestry Low Back Pain Disability Questionnaire: 30 / 50 = 60.0 %  SCREENING FOR RED FLAGS: Bowel or bladder incontinence: No Spinal tumors: No Cauda equina syndrome: No Compression fracture: No Abdominal aneurysm: No  COGNITION: Overall cognitive status: Within functional limits for tasks assessed  POSTURE: increased lumbar lordosis and anterior pelvic tilt      FUNCTIONAL TESTS:  5 times sit to stand: 15.42s with minimal upper extremity push off legs  .20-29 years: 6.0  1.4 seconds 30-39 years: 6.1  1.4 seconds 40-49 years: 7.6  1.8 seconds 50-59 years: 7.7  2.6 seconds 60-69 years: 8.4  0.0 seconds (female), 12.7  1.8 seconds (female) 70-79 years: 11.6  3.4 seconds (female), 13.0  4.8 seconds (female) 80-89 years: 16.7  4.5 seconds (female), 17.2  5.5 seconds (female) 90+ years: 19.5  2.3  seconds (female), 22.9  9.6 seconds (female)  The Minimal Clinically Important Difference (MCID) is 2.3 seconds.  A score of 16 seconds or less indicates that the participant is not likely to fall.  A score of more than 16 seconds indicates a higher risk of falls  As per American Physical Therapy Association (APTA) website   SENSATION: WFL   LUMBAR ROM:   AROM eval  Flexion Mid shin*  Extension 5*  Right lateral flexion Mid thigh   Left lateral flexion Mid thigh          (Blank rows = not tested; * = limited by pain)  LOWER EXTREMITY MMT:    Left hip/knee grossly 4-/5 except: hip flexion 3+/5 Right hip/knee grossly 4-/5 except: hip flexion 3+/5  LOWER EXTREMITY ROM:     Left hip/knee AROM grossly WFL except: left hip flexion 0-90* Right hip/knee AROM grossly WFL except:   LUMBAR SPECIAL TESTS:  Straight leg raise test: Negative Hip special tests: Luisa Hart (FABER) test: positive on both   PALPATION: Moderate tenderness to palpation lumbar paraspinals    TODAY'S TREATMENT:                                                                                                                              DATE:   07/17/23 PT I.E.  HEP see below   PATIENT EDUCATION:  Education details: HEP Person educated: Patient Education method: Explanation Education comprehension: verbalized understanding  HOME EXERCISE PROGRAM: Access Code: [email or web address] URL: [email or web address] Date: 07/17/2023 Prepared by: Seymour Bars  Exercises - Supine March with Resistance Band  - 1-2 x daily - 3 sets - 10 reps - Bridge with Abduction and Resistance Loop  - 1-2 x daily - 3 sets - 10 reps    ASSESSMENT:  CLINICAL IMPRESSION: Patient is a 53 y.o. y.o. female who was seen today for physical therapy evaluation and treatment for low back pain, muscle weakness. Patient presents to PT with the following objective impairments: decreased activity tolerance, decreased strength,  increased muscle spasms, impaired flexibility, postural dysfunction, and pain. These impairments limit the patient in activities such as carrying, lifting, bending, standing, squatting, stairs, and locomotion level. These impairments also limit the patient in participation such as meal prep, cleaning, laundry, shopping, community activity, and yard work. The patient will benefit from PT to address the limitations/impairments listed below to return to their prior level of function in the domains of activity and participation.    PERSONAL FACTORS:  social factors of stress at home  are also affecting patient's functional outcome.   REHAB POTENTIAL: Good  CLINICAL DECISION MAKING: Stable/uncomplicated  EVALUATION COMPLEXITY: Low      GOALS: Goals reviewed with patient? No  SHORT TERM GOALS: Target date: 3 sessions     1. Patient will be independent with a basic stretching/strengthening HEP  Baseline:  Goal status: INITIAL   LONG TERM GOALS: Target date: 6 sessions  Patient will be able to score a </= 50%  on the Modified  Oswestry    to demonstrate an improvement in overall housework, ADL completion, mobility, and self-care.Baseline:  Goal status: INITIAL  2.   Patient will complete the 5 times sit to stand:    within 12s  to demonstrate an improvement lower extremity strength needed for home and community ambulation  Baseline:  Goal status: INITIAL  3.  Patient will be independent with a comprehensive strengthening HEP  Baseline:  Goal status: INITIAL    PLAN:  PT FREQUENCY: 1x/week  PT DURATION: 6 weeks  PLANNED INTERVENTIONS: 97110-Therapeutic exercises, 97530- Therapeutic activity, [id number]- Neuromuscular re-education, 97535- Self Care, [id number]- Manual therapy, [phone]- Gait training, [phone]- Subsequent splinting/medication, 97014- Electrical stimulation (unattended), Patient/Family education, Balance training, Stair training, Taping, Dry Needling, Joint mobilization, Joint  manipulation, Spinal manipulation, Spinal mobilization, Cryotherapy, and Moist heat.  PLAN FOR NEXT SESSION: Progress patient through pelvic tilting in supine to decrease APT; work on core muscle strength; transitions into positions such as prone, quadruped when pain-free in supine  .[facility] Medicare Auth Request Information  Date of referral:   07/10/2023   Referring provider:   Darreld Mclean, MD   Referring diagnosis (ICD 10)?  Diagnosis  M54.16 (ICD-10-CM) - Lumbar radiculopathy  M54.16,G89.29 (ICD-10-CM) - Chronic radicular lumbar pain  M54.50,M79.604,M79.605 (ICD-10-CM) - Lumbar pain with radiation down both legs  M47.26 (ICD-10-CM) - Other spondylosis with radiculopathy, lumbar region   Treatment diagnosis (ICD 10)? (if different than referring diagnosis) m54.59, m62.81  Functional Tool Score: 5 times sit to stand: 15.42s with minimal upper extremity push off legs   What was this (referring dx) caused by? Ongoing Issue  Ashby Dawes of Condition: Chronic (continuous duration > 3 months)   Laterality: Both  Current Functional Measure Score: Back Index Modified Oswestry Low Back Pain Disability Questionnaire: 30 / 50 = 60.0 %  Objective measurements identify impairments when they are compared to normal values, the uninvolved extremity, and prior level of function.  []  Yes  []  No  Objective assessment of functional ability: Minimal functional limitations   Briefly describe symptoms: patient with low back pain that can shoot down both legs  How did symptoms start: ongoing; patient had 3 falls this month  Average pain intensity:  Last 24 hours: 1/10  Past week: 6/10  How often does the pt experience symptoms? Frequently  How much have the symptoms interfered with usual daily activities? Moderately  How has condition changed since care began at this facility? NA - initial visit  In general, how is the patients overall health? Good   BACK PAIN (STarT Back Screening  Tool) No   Seymour Bars, PT 07/17/2023, 4:10 PM

## 2023-07-22 ENCOUNTER — Encounter (HOSPITAL_COMMUNITY): Payer: Self-pay

## 2023-07-22 ENCOUNTER — Ambulatory Visit (HOSPITAL_COMMUNITY): Payer: Medicare Other | Attending: Orthopaedic Surgery

## 2023-07-22 DIAGNOSIS — M6281 Muscle weakness (generalized): Secondary | ICD-10-CM | POA: Diagnosis present

## 2023-07-22 DIAGNOSIS — M5459 Other low back pain: Secondary | ICD-10-CM | POA: Insufficient documentation

## 2023-07-22 NOTE — Therapy (Signed)
[facility] OUTPATIENT PHYSICAL THERAPY EVALUATION (THORACOLUMBAR)   Patient Name: Christina Decker MRN: [id number] DOB:1970-12-18, 53 y.o., female Today's Date: 07/22/2023  END OF SESSION:   PT End of Session - 07/22/23 1019     Visit Number 2    Number of Visits 6    Date for PT Re-Evaluation 08/28/23    Authorization Type [employer] Medicare    PT Start Time 1020    PT Stop Time 1100    PT Time Calculation (min) 40 min    Activity Tolerance Patient tolerated treatment well    Behavior During Therapy WFL for tasks assessed/performed              Past Medical History:  Diagnosis Date   Abnormal heart rhythms    Abnormal thyroid stimulating hormone level    Anxiety    Balance problem    Cervical disc disease    Depression    Diabetes mellitus without complication (HCC)    Dyslipidemia    Dyspareunia due to medical condition in female    Dysrhythmia    Environmental and seasonal allergies    Fatty liver disease, nonalcoholic    Fibrocystic breast disease    Fibromuscular hyperplasia (HCC)    Fibromuscular hyperplasia (HCC)    Fibromyalgia    Fibromyalgia    Hemorrhoid    Hx of adenomatous colonic polyps    Hyperkalemia    Hypertension    IBS (irritable bowel syndrome)    IBS (irritable bowel syndrome)    Low back pain    Lower extremity edema    Migraine    Narcolepsy    Narcolepsy and cataplexy    Pain in left arm    Pain in left shoulder    Syncope    Thyroid disease    Vitamin D deficiency    Past Surgical History:  Procedure Laterality Date   ABDOMINAL HYSTERECTOMY     ANTERIOR CERVICAL DECOMP/DISCECTOMY FUSION N/A 10/16/2017   Procedure: ANTERIOR CERVICAL DECOMPRESSION/DISCECTOMY FUSION 2 LEVELS-C5-7, C6 CORPECTOMY;  Surgeon: Venetia Night, MD;  Location: [facility] ORS;  Service: Neurosurgery;  Laterality: N/A;   COLONOSCOPY WITH PROPOFOL N/A 03/29/2022   Procedure: COLONOSCOPY WITH PROPOFOL;  Surgeon: Wyline Mood, MD;  Location: [facility] ENDOSCOPY;  Service:  Gastroenterology;  Laterality: N/A;   HEMORRHOID SURGERY     HERNIA REPAIR     Patient Active Problem List   Diagnosis Date Noted   Rectal bleeding    Adenomatous polyp of colon    DOE (dyspnea on exertion) 03/13/2022   Carotid artery occlusion 09/12/2020   IBS (irritable bowel syndrome) 09/12/2020   Migraines 09/12/2020   Thyroid disease 09/12/2020   Carotid stenosis 09/09/2020   Cervical myelopathy (HCC) 10/16/2017   Essential hypertension 07/19/2016   Fibromyalgia 07/19/2016   Fibromuscular dysplasia (HCC) 07/19/2016   DJD (degenerative joint disease) 07/19/2016   Bilateral low back pain without sciatica 04/12/2014   Panic anxiety syndrome 02/18/2014   Hyperlipidemia, mixed 02/18/2014   Narcolepsy 02/18/2014   Obesity 02/18/2014   Chest pain syndrome 02/12/2014    PCP: Joana Reamer, DORef Provider (PCP)   REFERRING PROVIDER:   Darreld Mclean, MD    REFERRING DIAG:  Diagnosis  M54.16 (ICD-10-CM) - Lumbar radiculopathy  M54.16,G89.29 (ICD-10-CM) - Chronic radicular lumbar pain  M54.50,M79.604,M79.605 (ICD-10-CM) - Lumbar pain with radiation down both legs  M47.26 (ICD-10-CM) - Other spondylosis with radiculopathy, lumbar region    Rationale for Evaluation and Treatment: Rehabilitation  THERAPY DIAG:  Other low back pain  Muscle weakness (generalized)  ONSET DATE: 1+ year    SUBJECTIVE:                                                                                                                                                                                           SUBJECTIVE STATEMENT: Reports LBP pain scale 3-4/10 achy pain.  Stated she has been unable to begin HEP due to hemroids.  Patient reports that in the last year, her back "gives out" to a point where she cannot walk for a few days. In October and December, patient had syncopal episodes. Patient went to Dr Hilda Lias on 07/10/23 who referred patient to OPPT  PERTINENT HISTORY:  Fibromylagia   ANTERIOR CERVICAL DECOMP/DISCECTOMY FUSION  10/16/2017  PAIN:  Are you having pain? Yes: NPRS scale: 3-4/10 Pain location: lumbar; can shoot down to bilateral legs  Pain description: shooting Aggravating factors: ADLs Relieving factors: varies  PRECAUTIONS: None  RED FLAGS: None   WEIGHT BEARING RESTRICTIONS: No  FALLS:  Has patient fallen in last 6 months? Yes. Number of falls 5+  LIVING ENVIRONMENT: Lives with: lives with their spouse Lives in: House/apartment Stairs: Yes: External: 5 steps; can reach both Has following equipment at home: Single point cane, Walker - 2 wheeled, Wheelchair (manual), and Tour manager How many hours do you sleep every night: < 6 hours How many minutes of moderate exercise do you get weekly: 0 min  OCCUPATION: on disability; not working   PLOF: Independent   PATIENT GOALS: To get my back better  NEXT MD VISIT: Feb 5th 2025    OBJECTIVE:   DIAGNOSTIC FINDINGS:   MRI 08/15/22 IMPRESSION: Multilevel facet arthropathy, worst at L4-L5, where there is grade 1 anterolisthesis and mild spinal canal stenosis. No significant neural foraminal narrowing at any level.  PATIENT SURVEYS:  Modified Oswestry Low Back Pain Disability Questionnaire: 30 / 50 = 60.0 %  SCREENING FOR RED FLAGS: Bowel or bladder incontinence: No Spinal tumors: No Cauda equina syndrome: No Compression fracture: No Abdominal aneurysm: No  COGNITION: Overall cognitive status: Within functional limits for tasks assessed  POSTURE: increased lumbar lordosis and anterior pelvic tilt      FUNCTIONAL TESTS:  5 times sit to stand: 15.42s with minimal upper extremity push off legs  .20-29 years: 6.0  1.4 seconds 30-39 years: 6.1  1.4 seconds 40-49 years: 7.6  1.8 seconds 50-59 years: 7.7  2.6 seconds 60-69 years: 8.4  0.0 seconds (female), 12.7  1.8 seconds (female) 70-79 years: 11.6  3.4 seconds (female), 13.0  4.8 seconds (female) 80-89 years: 16.7  4.5  seconds (female), 17.2  5.5 seconds (female)  90+ years: 19.5  2.3 seconds (female), 22.9  9.6 seconds (female)  The Minimal Clinically Important Difference (MCID) is 2.3 seconds.  A score of 16 seconds or less indicates that the participant is not likely to fall.  A score of more than 16 seconds indicates a higher risk of falls  As per American Physical Therapy Association (APTA) website   SENSATION: WFL   LUMBAR ROM:   AROM eval  Flexion Mid shin*  Extension 5*  Right lateral flexion Mid thigh   Left lateral flexion Mid thigh          (Blank rows = not tested; * = limited by pain)  LOWER EXTREMITY MMT:    Left hip/knee grossly 4-/5 except: hip flexion 3+/5 Right hip/knee grossly 4-/5 except: hip flexion 3+/5  LOWER EXTREMITY ROM:     Left hip/knee AROM grossly WFL except: left hip flexion 0-90* Right hip/knee AROM grossly WFL except:   LUMBAR SPECIAL TESTS:  Straight leg raise test: Negative Hip special tests: Luisa Hart (FABER) test: positive on both   PALPATION: Moderate tenderness to palpation lumbar paraspinals    TODAY'S TREATMENT:                                                                                                                              DATE:  07/22/23: Reviewed goals Educated importance of HEP compliance for maximal benefits  Supine: Log rolling Deep breathing x 1' Deep breathing paired with TrA activation paired with exhalation x 3' Marching paired with TrA activation x 2' Clam with RTB with TrA activiation 10x 5" each side Bridge with RTB with ab set prior lift 10x 5" holds SKTC 3x 20"  Seated: Anterior/posterior pelvic tilt 5x 5"  07/17/23 PT I.E.  HEP see below   PATIENT EDUCATION:  Education details: HEP Person educated: Patient Education method: Explanation Education comprehension: verbalized understanding  HOME EXERCISE PROGRAM: Access Code: [email or web address] URL: [email or web address] Date: 07/17/2023 Prepared  by: Seymour Bars  Exercises - Supine March with Resistance Band  - 1-2 x daily - 3 sets - 10 reps - Bridge with Abduction and Resistance Loop  - 1-2 x daily - 3 sets - 10 reps    ASSESSMENT:  CLINICAL IMPRESSION: 07/22/23:  Reviewed goals and educated importance of HEP compliance for maximal benefits.  Session focus with core stability.  Began session with neuro re-ed to improve TrA activation, paired with exhalation to reduce holding breath.  Pt tolerated well to session with no reports of increased pain through session.  Improved abdominal sets at EOS with less verbal and tactile cueing required.    Eval:  Patient is a 53 y.o. y.o. female who was seen today for physical therapy evaluation and treatment for low back pain, muscle weakness. Patient presents to PT with the following objective impairments: decreased activity tolerance, decreased strength, increased muscle spasms, impaired flexibility, postural dysfunction, and pain. These impairments limit the patient in activities such as carrying, lifting,  bending, standing, squatting, stairs, and locomotion level. These impairments also limit the patient in participation such as meal prep, cleaning, laundry, shopping, community activity, and yard work. The patient will benefit from PT to address the limitations/impairments listed below to return to their prior level of function in the domains of activity and participation.    PERSONAL FACTORS:  social factors of stress at home  are also affecting patient's functional outcome.   REHAB POTENTIAL: Good  CLINICAL DECISION MAKING: Stable/uncomplicated  EVALUATION COMPLEXITY: Low      GOALS: Goals reviewed with patient? No  SHORT TERM GOALS: Target date: 3 sessions     1. Patient will be independent with a basic stretching/strengthening HEP  Baseline:  Goal status: INITIAL   LONG TERM GOALS: Target date: 6 sessions  Patient will be able to score a </= 50%  on the Modified Oswestry     to demonstrate an improvement in overall housework, ADL completion, mobility, and self-care.Baseline:  Goal status: INITIAL  2.   Patient will complete the 5 times sit to stand:    within 12s  to demonstrate an improvement lower extremity strength needed for home and community ambulation  Baseline:  Goal status: INITIAL  3.  Patient will be independent with a comprehensive strengthening HEP  Baseline:  Goal status: INITIAL    PLAN:  PT FREQUENCY: 1x/week  PT DURATION: 6 weeks  PLANNED INTERVENTIONS: 97110-Therapeutic exercises, 97530- Therapeutic activity, [id number]- Neuromuscular re-education, 97535- Self Care, 16109- Manual therapy, [phone]- Gait training, [phone]- Subsequent splinting/medication, 97014- Electrical stimulation (unattended), Patient/Family education, Balance training, Stair training, Taping, Dry Needling, Joint mobilization, Joint manipulation, Spinal manipulation, Spinal mobilization, Cryotherapy, and Moist heat.  PLAN FOR NEXT SESSION: Progress patient through pelvic tilting in supine to decrease APT; work on core muscle strength; transitions into positions such as prone, quadruped when pain-free in supine   Becky Sax, LPTA/CLT; CBIS [phone]  Juel Burrow, PTA 07/22/2023, 12:05 PM

## 2023-07-24 ENCOUNTER — Ambulatory Visit: Payer: Medicare Other | Admitting: Orthopaedic Surgery

## 2023-07-24 ENCOUNTER — Encounter: Payer: Self-pay | Admitting: Orthopaedic Surgery

## 2023-07-24 VITALS — BP 134/94 | HR 87 | Ht 65.0 in | Wt 169.0 lb

## 2023-07-24 DIAGNOSIS — G8929 Other chronic pain: Secondary | ICD-10-CM

## 2023-07-24 DIAGNOSIS — M5416 Radiculopathy, lumbar region: Secondary | ICD-10-CM | POA: Diagnosis not present

## 2023-07-24 NOTE — Progress Notes (Signed)
 I am a little better.  She has been to PT and is better. She has learned her exercises. She has less pain.  She is more active.  NV intact. ROM of the back is good, no spasm, muscle tone and strength normal.  Gait normal.  Encounter Diagnosis  Name Primary?   Chronic radicular lumbar pain Yes   She would prefer to continue her exercises at home and stop PT.  She prefers to see me as needed.  She knows she may need a MRI if pain gets worse.  Call if any problem.  Precautions discussed.  Electronically Signed Lemond Stable, MD 2/5/20259:50 AM

## 2023-08-02 ENCOUNTER — Ambulatory Visit (HOSPITAL_COMMUNITY): Payer: Medicare Other

## 2023-08-08 ENCOUNTER — Encounter (HOSPITAL_COMMUNITY): Payer: Medicare Other

## 2023-08-15 ENCOUNTER — Encounter (HOSPITAL_COMMUNITY): Payer: Medicare Other

## 2023-08-22 ENCOUNTER — Encounter (HOSPITAL_COMMUNITY): Payer: Medicare Other

## 2023-09-27 ENCOUNTER — Other Ambulatory Visit: Payer: Self-pay | Admitting: Neurology

## 2023-09-27 DIAGNOSIS — R55 Syncope and collapse: Secondary | ICD-10-CM

## 2023-10-01 ENCOUNTER — Ambulatory Visit
Admission: RE | Admit: 2023-10-01 | Discharge: 2023-10-01 | Disposition: A | Discharge status: Home / Self Care | Source: Ambulatory Visit | Attending: Neurology | Admitting: Neurology

## 2023-10-01 DIAGNOSIS — R55 Syncope and collapse: Secondary | ICD-10-CM | POA: Diagnosis present

## 2023-10-09 IMAGING — DX DG CHEST 2V
2 series · 2 of 2 positions shown · non-contrast
Comparison: None.

CLINICAL DATA: Left-sided chest pain over the last 2 days. Pain
radiates to the left arm.

EXAM:
CHEST - 2 VIEW

[chest pa]
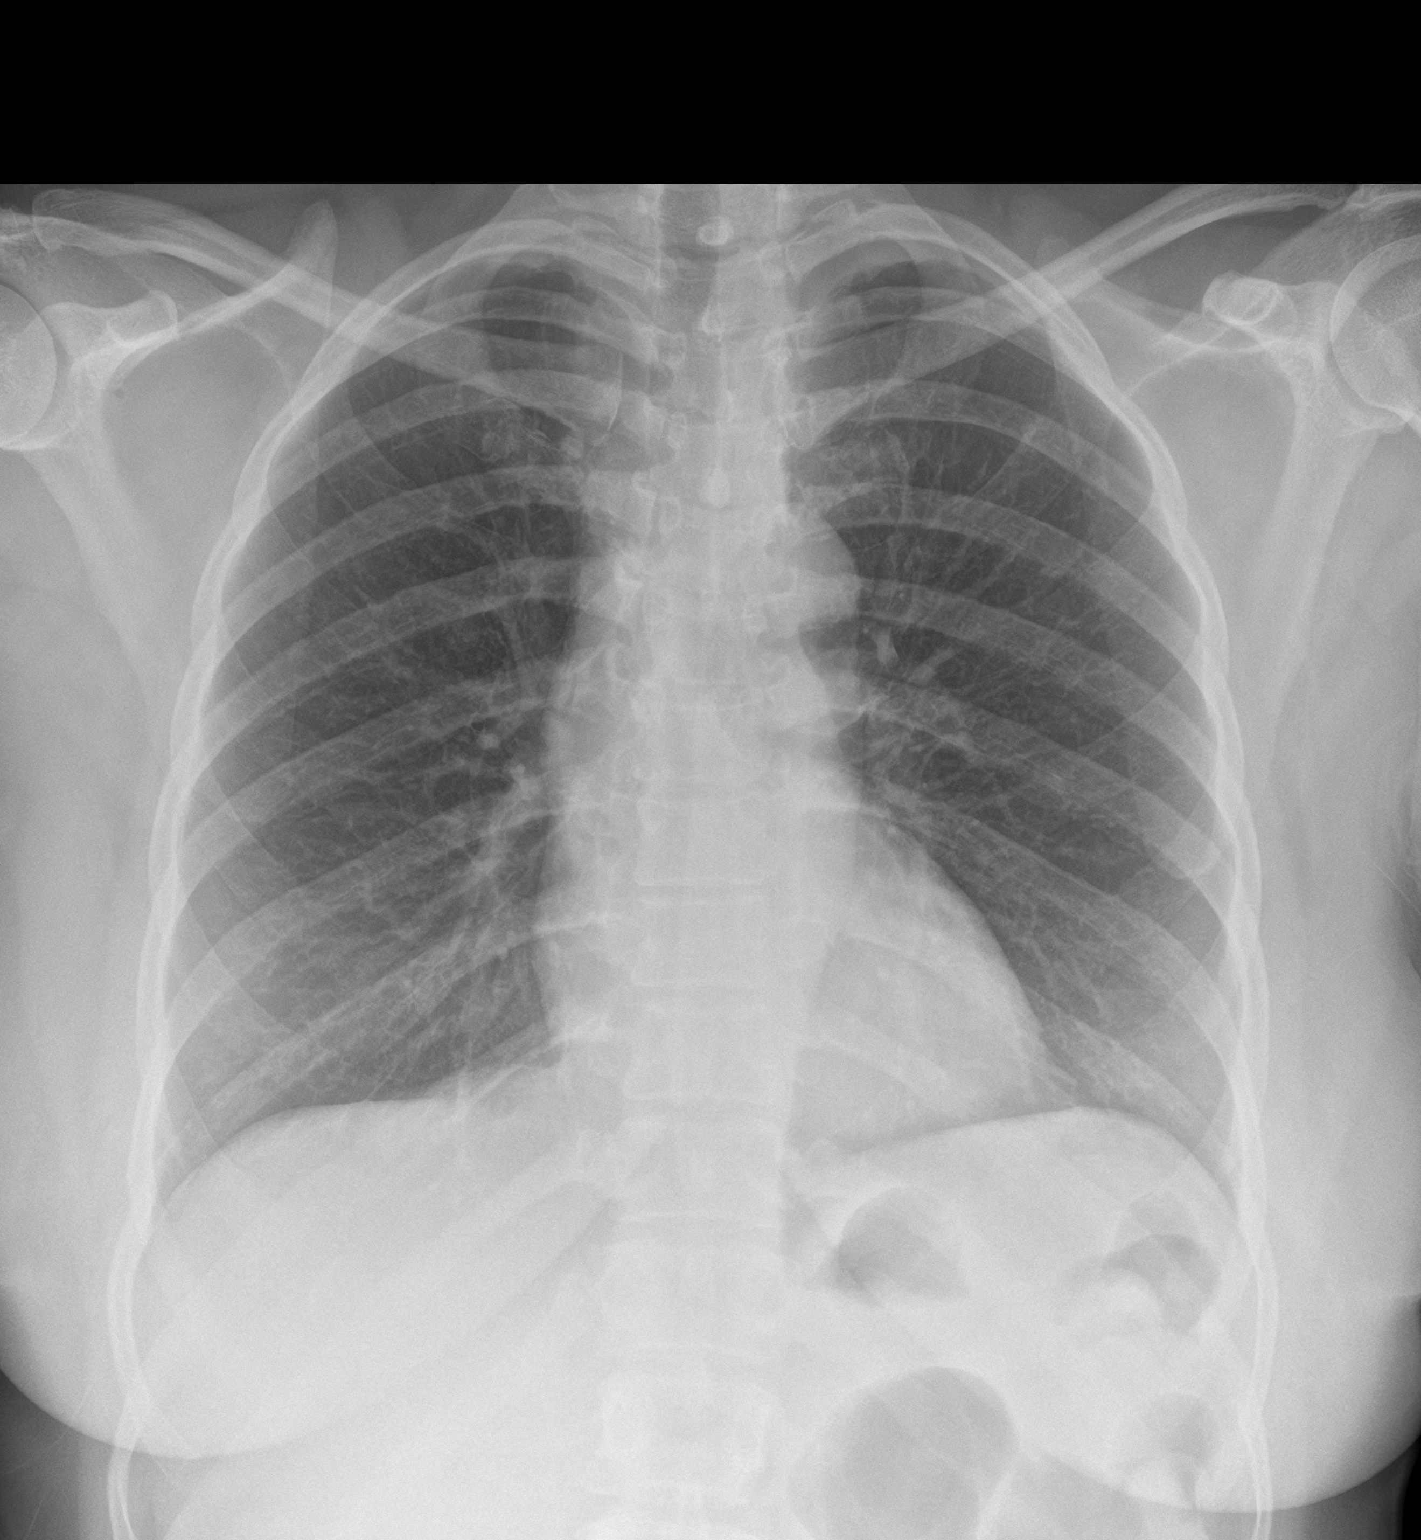

[chest lat]
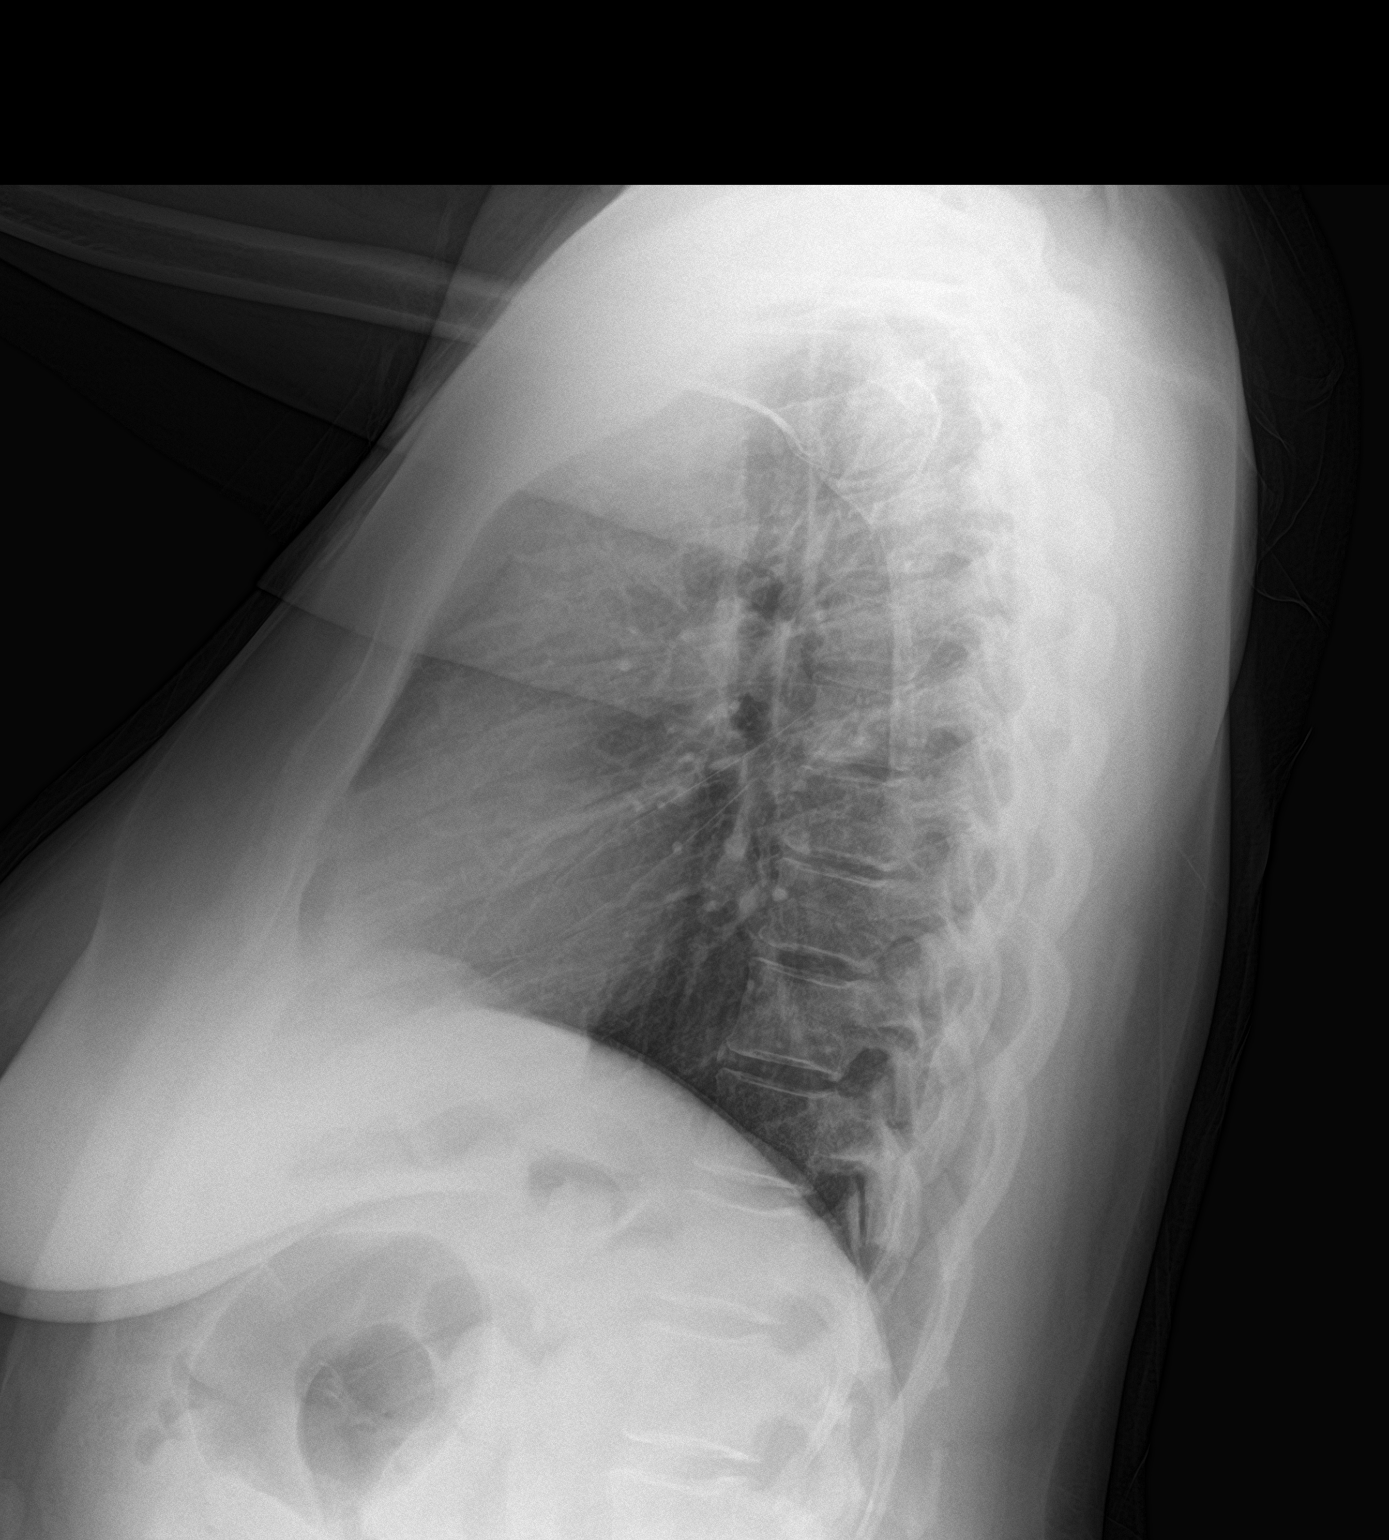

[2 of 2 positions shown; findings below may reference images not displayed]

FINDINGS: The heart size and mediastinal contours are within normal limits.
Both lungs are clear. The visualized skeletal structures are
unremarkable.
IMPRESSION: No active cardiopulmonary disease.

## 2024-02-07 ENCOUNTER — Encounter: Payer: Self-pay | Admitting: Radiology

## 2024-04-20 ENCOUNTER — Encounter: Payer: Self-pay | Admitting: Radiology
